# Patient Record
Sex: Female | Born: 1990
Health system: Southern US, Community
[De-identification: ages and names within clinical notes are randomized; demographics above are authoritative.]

## PROBLEM LIST (undated history)

## (undated) DIAGNOSIS — N910 Primary amenorrhea: Secondary | ICD-10-CM

## (undated) DIAGNOSIS — D649 Anemia, unspecified: Secondary | ICD-10-CM

## (undated) DIAGNOSIS — Q201 Double outlet right ventricle: Secondary | ICD-10-CM

## (undated) DIAGNOSIS — E039 Hypothyroidism, unspecified: Secondary | ICD-10-CM

## (undated) DIAGNOSIS — E2839 Other primary ovarian failure: Secondary | ICD-10-CM

## (undated) DIAGNOSIS — E063 Autoimmune thyroiditis: Secondary | ICD-10-CM

## (undated) DIAGNOSIS — K219 Gastro-esophageal reflux disease without esophagitis: Secondary | ICD-10-CM

## (undated) DIAGNOSIS — E049 Nontoxic goiter, unspecified: Secondary | ICD-10-CM

## (undated) DIAGNOSIS — J302 Other seasonal allergic rhinitis: Secondary | ICD-10-CM

## (undated) DIAGNOSIS — L309 Dermatitis, unspecified: Secondary | ICD-10-CM

## (undated) DIAGNOSIS — E288 Other ovarian dysfunction: Secondary | ICD-10-CM

## (undated) DIAGNOSIS — J45909 Unspecified asthma, uncomplicated: Secondary | ICD-10-CM

## (undated) HISTORY — DX: Nontoxic goiter, unspecified: E04.9

## (undated) HISTORY — DX: Hypothyroidism, unspecified: E03.9

## (undated) HISTORY — DX: Other ovarian dysfunction: E28.8

## (undated) HISTORY — DX: Gastro-esophageal reflux disease without esophagitis: K21.9

## (undated) HISTORY — PX: TONSILLECTOMY AND ADENOIDECTOMY: SHX28

## (undated) HISTORY — DX: Other primary ovarian failure: E28.39

## (undated) HISTORY — DX: Double outlet right ventricle: Q20.1

## (undated) HISTORY — DX: Primary amenorrhea: N91.0

## (undated) HISTORY — PX: ADENOIDECTOMY: SUR15

## (undated) HISTORY — PX: TONSILLECTOMY: SUR1361

## (undated) HISTORY — DX: Autoimmune thyroiditis: E06.3

## (undated) HISTORY — DX: Unspecified asthma, uncomplicated: J45.909

## (undated) HISTORY — DX: Dermatitis, unspecified: L30.9

---
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
## 2012-01-31 DIAGNOSIS — Z0271 Encounter for disability determination: Secondary | ICD-10-CM

## 2012-02-20 ENCOUNTER — Other Ambulatory Visit: Payer: Self-pay | Admitting: *Deleted

## 2012-02-20 DIAGNOSIS — E039 Hypothyroidism, unspecified: Secondary | ICD-10-CM

## 2012-02-29 ENCOUNTER — Other Ambulatory Visit: Payer: Self-pay | Admitting: *Deleted

## 2012-02-29 DIAGNOSIS — E063 Autoimmune thyroiditis: Secondary | ICD-10-CM

## 2012-02-29 MED ORDER — LEVOTHYROXINE SODIUM 50 MCG PO TABS: ORAL_TABLET | ORAL | Status: DC

## 2012-02-29 MED ORDER — OMEPRAZOLE 20 MG PO CPDR: 20.0000 mg | DELAYED_RELEASE_CAPSULE | Freq: Two times a day (BID) | ORAL | Status: DC

## 2012-03-05 LAB — TSH: TSH: 3.236 u[IU]/mL (ref 0.350–4.500)

## 2012-03-05 LAB — T4, FREE: Free T4: 1.6 ng/dL (ref 0.80–1.80)

## 2012-03-05 LAB — T3, FREE: T3, Free: 3.1 pg/mL (ref 2.3–4.2)

## 2012-03-13 ENCOUNTER — Encounter: Payer: Self-pay | Admitting: "Endocrinology

## 2012-03-13 ENCOUNTER — Ambulatory Visit (INDEPENDENT_AMBULATORY_CARE_PROVIDER_SITE_OTHER): Payer: BC Managed Care – PPO | Admitting: "Endocrinology

## 2012-03-13 VITALS — BP 113/72 | HR 61 | Wt 144.6 lb

## 2012-03-13 DIAGNOSIS — Q51811 Hypoplasia of uterus: Secondary | ICD-10-CM

## 2012-03-13 DIAGNOSIS — N910 Primary amenorrhea: Secondary | ICD-10-CM

## 2012-03-13 DIAGNOSIS — E063 Autoimmune thyroiditis: Secondary | ICD-10-CM

## 2012-03-13 DIAGNOSIS — N912 Amenorrhea, unspecified: Secondary | ICD-10-CM

## 2012-03-13 DIAGNOSIS — E669 Obesity, unspecified: Secondary | ICD-10-CM

## 2012-03-13 DIAGNOSIS — K219 Gastro-esophageal reflux disease without esophagitis: Secondary | ICD-10-CM

## 2012-03-13 DIAGNOSIS — E038 Other specified hypothyroidism: Secondary | ICD-10-CM

## 2012-03-13 DIAGNOSIS — R6250 Unspecified lack of expected normal physiological development in childhood: Secondary | ICD-10-CM

## 2012-03-13 DIAGNOSIS — Q519 Congenital malformation of uterus and cervix, unspecified: Secondary | ICD-10-CM

## 2012-03-13 LAB — GLUCOSE, POCT (MANUAL RESULT ENTRY): POC Glucose: 94 mg/dl (ref 70–99)

## 2012-03-13 LAB — POCT GLYCOSYLATED HEMOGLOBIN (HGB A1C): Hemoglobin A1C: 5

## 2012-03-13 MED ORDER — LEVOTHYROXINE SODIUM 75 MCG PO TABS: 75.0000 ug | ORAL_TABLET | Freq: Every day | ORAL | Status: DC

## 2012-03-13 NOTE — Progress Notes (Signed)
Chief complaint: Follow-up of primary amenorrhea, hypoplastic uterus, hypothyroidism, Hashimoto's thyroiditis, goiter, and gastroesophageal reflux disease  History of present illness: The patient is an  21 and 19/19 year-old Caucasian female. She was accompanied by her father.  1. I have followed this young woman since 10/28/2010 when she was referred by Dr. Jaymes Graff, [facility] and Gynecology for evaluation of hypothyroidism and primary amenorrhea.   A. At age 19 months the child was noted to be somewhat slow to walk. She was referred to a pediatric neurologist, Dr. Ellison Carwin, for further evaluation. Dr. Sharene Skeans noted some minor gross and fine motor delays, but also noted a significant cardiac murmur. 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 After several years of follow-up, the patient underwent corrective surgery in 2009. She has done well clinically since that time.  B. 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 The geneticist noted the presence of a scoliosis and some limb length discrepancy. She had flattening of the left occipital region as compared to the right. Her scalp hair was thin, especially sparse in the temporal regions. The forehead was flat. She had short palpebral fissures and a wide nasal bridge. Epicanthal folds were present. She had a thin upper lip and some underbite. She also had facial asymmetry with the right cheek being more prominent than the left. The lower lip was also more prominent on the right than on the left. The neck was short and wide. The nipples were felt to be widely spaced. Labia majora were slightly hypoplastic. She had mild soft tissue syndactyly between the third and fourth digits bilaterally. The  right lower extremity was longer than the left. The right foot was longer than the left foot. The right thigh was somewhat longer than the left thigh. The right leg was longer in length compared to the left. All of the right toes were larger than the left toes. Left nails were smaller and dystrophic. She also had a thoracolumbar scoliosis with convexity to the right. She walked with a tilt of her pelvis to the left side. 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 Because the clinical findings suggested the possibility of a genetic disorder, a karyotype was obtained. She had a 42, XX karyotype. A FISH study was also performed. This study showed no evidence for the common form of DiGeorge Syndrome. The geneticist was not able to make a specific genetic diagnosis. Although the geneticist requested that the family return for a follow-up examination, it appears that did not happen.  C. On 09/03/10 the patient was evaluated in [facility]. The reason for referral was primary amenorrhea at age 21. The patient's breast were noted to be Tanner stage III. The vagina looked normal externally. The patient was unable to tolerate either a speculum or pelvic exam. Pregnancy test was negative. Laboratory data showed a TSH of 9.296, free T4 0.91, and T3 of 126.5. TSH was definitely elevated. The free T4 and T3 were in the lower portion of the normal range FSH was 92.5. Prolactin was 4.2. Repeat karyotype was 66, XX. A pelvic ultrasound was performed in the [facility] office. The uterus and ovaries were not clearly seen. Endometrial stripe was not seen. It was commented that additional  imaging may be necessary.  D. The patient was seen by our physician assistant, who obtained additional history to include past issues with allergies, asthma, and gastroesophageal reflux disease. At that point the patient was taking Singulair daily, Advair daily,  omeprazole daily, and cetirizine (Zyrtec) as needed. On examination the patient weighed 148 pounds which was at the 80th percentile. Her height was at the 8th percentile. Her blood pressure was 124/80 and heart rate was 68. She did have somewhat dysmorphic facies. A small, diffusely enlarged, nontender goiter was noted. Laboratory data showed a TSH of 7.517, free T4 of 1.02, and free T3 of 3.4. FSH was 90 and LH was 35. Testosterone was 23.56. Estradiol was 17.2. At that point she was started on Synthroid, 25 mcg per day. 2. Upon our physician assistant's departure at the end of March, the patient was rescheduled to see me on 01/31/2011. She was taking her Synthroid 25, mcg per day at that time. Her goiter was approximately 20-25 g in size. The thyroid was non-tender. She was still amenorrheic. Thyroid tests done on that day showed a TSH of 1.667, free T4  1.26, and free T3 3.5. These tests were mid-range normal. Her TPO antibody was elevated at 299. She was trying to follow our  Eat Right Diet plan. She was also walking at least an hour per day. She had lost 16 pounds. At her visit on 08/30/11, I met with the patient and her father.The patient was then taking Synthroid, 37.5 mcg/day.  I learned that her mother was not attending the visits in our clinic on the building's third floor because the mother was very afraid of heights and elevators. I made arrangements to see the patient and parents in the first floor radiology conference room on 09/13/11. Dr. Lendon Colonel, MD, PhD, our staff geneticist, joined me on that visit and we provided education to the family about possible Turner's Syndrome mosaicism, hypothyroidism, and Hashimoto''s diease.  4. The patient's last [facility] clinic visit was on 12/14/11. In the interim, she has been healthy. She continues taking Synthroid at 50 mcg/day and omeprazole, 20 mg, twice daily. A buccal smear was obtained by Dr. Erik Obey. No evidence for Turner's mosaicism was  identified.  5.Pertinent Review of Systems: Constitutional: The patient feels "good". She is healthy, active, and has no significant complaints. Eyes: Vision is good. There are no significant eye complaints. Neck: The patient has no complaints of anterior neck swelling, soreness, tenderness,  pressure, discomfort, or difficulty swallowing.  Heart: Heart rate increases with exercise or other physical activity. The patient has no complaints of palpitations, irregular heat beats, chest pain, or chest pressure. Gastrointestinal: Belly hunger comes and goes. Bowel movents seem normal. The patient has no complaints of excessive hunger, acid reflux, upset stomach, stomach aches or pains, diarrhea, or constipation. Legs: Muscle mass and strength seem normal. There are no complaints of numbness, tingling, burning, or pain. No edema is noted. Feet: There are no obvious foot problems. There are no complaints of numbness, tingling, burning, or pain. No edema is noted. Hands: She is able to play video games and text quite well.  GYN: The patient has not had any significant change in pubertal development.  PAST MEDICAL, FAMILY, AND SOCIAL HISTORY:  1. School and Family: She is not in school anymore.  2.Activities: She swims in her family's pool twice a day. She also walks a lot.  3. Tobacco, alcohol, and illicit drugs: None 4. PCP: Dr. Lucio Edward 5. GYN: Dr.  Naima Dillard  ROS: There are no other significant problems involving her other body systems.  PHYSICAL EXAM: BP 113/72  Pulse 61  Wt 144 lb 9.6 oz (65.59 kg) Constitutional: The patient looks healthy and appears physically and emotionally well. Although she is alert and pleasant, she does not initiate conversation. Her answers to my questions are very brief. She prefers to focus on her video game. Facies remain asymetric/dysmorphic. The mouth is somewhat twisted rotationally.  Eyes: There is no arcus or proptosis.  Mouth: The oropharynx  appears normal. The tongue appears normal. There is normal oral moisture. There is no obvious gingivitis. Neck: There are no bruits present. The thyroid gland appears normal in size. The thyroid gland is low-lying and is approximately 20+ grams in size. The right lobe is normal. The isthmus is normal. The left lobe is mildly enlarged and somewhat firm.The consistency of the thyroid gland is relatively normal. There is no thyroid tenderness to palpation. Lungs: The lungs are clear. Air movement is good. Heart: The heart rhythm and rate appear normal. Heart sounds S1 and S2 are normal. I do not appreciate any pathologic heart murmurs. Abdomen: The abdominal size is enlarged. Bowel sounds are normal. The abdomen is soft and non-tender. There is no obviously palpable hepatomegaly, splenomegaly, or other masses.  Arms: Muscle mass appears appropriate for age.  Hands: There is no obvious tremor. Phalangeal and metacarpophalangeal joints appear normal. Palms are normal. Legs: Muscle mass appears appropriate for age. There is no edema.  Feet: Normal 1-2+ DP pulses.  Neurologic: Muscle strength is normal for age and gender  in both the upper and the lower extremities. Muscle tone appears normal. Sensation to touch is normal in the legs and feet. Back: she has a marked dorsal kyphosis of kyphoscoliosis.  Laboratory data: 03/05/12: TSH 3.236, free T4 1.60, free T3 3.1      12/14/11: TSH 3.8, free T4 1/39, free T3 3.3, TPO 381, LH 33.4, FSH 86.9, testosterone 33.5, free testosterone 7.2 (normal 1.0-5.0)  Estradiol 13.6,                              08/22/11: TSH 3.183, free T4 1.40, free T3 3.4. testosterone 34.45, estradiol 14.8                  Buccal smear - normal  ASSESSMENT: 1. Hypothyroidism: The patient was hypothyroid again in February on Synthroid, 37.5 mcg/day. We increased her Synthroid dose then to 50 mcg/day. Repeat labs this month showed that she is still mildly hypothyroid. She appears to be  losing thyrocytes faster than we are replacing thyroid hormone.  She needs another dose increase of Synthroid. 2. Hashimoto's thyroiditis: Her Hashimoto's disease is clinically quiescent, but obviously intermittently active. 3. Goiter: The thyroid gland is smaller today, suggesting that she had more thyroid inflammation at last visit than she does now. 4. Primary Amenorrhea/Congenital small uterus: This young woman has a very small uterus. It is unclear from serial [place] studies if she has ovaries, but since she does secrete some testosterone and estradiol, she likely has some ovarian tissue. She could also have one or two ovotestes. All of the tests that have been done so far have ruled out Turner's syndrome and Turner's mosaicism. It is likely that she has some other Disorder of Sexual Development or some other disorder in which uterine and ovarian problems are part of the underlying genetic anomalies. I have discussed  her case again with Dr. Lendon Colonel, who also would like to determine the cause of Shalisha's condition. 5. Obesity: Patient has maintained a stable weight for the past three months. 6. Developmental delays: As noted in November, the patient had an IQ test which gave her an IQ of 64. In my visits with her, she seems to be more mentally retarded than that. Perhaps she is just too shy to talk.  7. GERD: Her GERD is well-controled with omeprazole. Her dyspepsia may not be quite as well controlled.  PLAN: 1. Diagnostic: Repeat the TFTs in two months.   2. Therapeutic: Increase Synthroid to 75 mcg per day.  3. Patient education: Dr. Erik Obey and I want to contact Dr. Wilnette Kales at [facility] to discuss HRT replacement management with her. We discussed the fact that we want to start HRT in order to achieve as much bone mineralization as possible. The uterus may or may not grow. We also discussed the possible need for gonadectomy to remove any possible testicular tissue. I will call Dr.  Normand Sloop once we have talked with Dr. Micael Hampshire so that together we can establish the plan for HRT. 4. Follow-up: Follow-up visit in 2-3 months.  Level of Service: This visit lasted in excess of 40 minutes. More than 50% of the visit was devoted to counseling.  David Stall

## 2012-03-13 NOTE — Patient Instructions (Signed)
Follow-up visit in 2-3 months. Increase synthroid to 75 mcg/day. Repeat thyroid lab tests in two months.

## 2012-03-20 ENCOUNTER — Other Ambulatory Visit: Payer: Self-pay | Admitting: Pediatrics

## 2012-05-10 LAB — T3, FREE: T3, Free: 3.1 pg/mL (ref 2.3–4.2)

## 2012-05-10 LAB — TSH: TSH: 0.351 u[IU]/mL (ref 0.350–4.500)

## 2012-05-10 LAB — T4, FREE: Free T4: 1.73 ng/dL (ref 0.80–1.80)

## 2012-05-16 ENCOUNTER — Ambulatory Visit
Admission: RE | Admit: 2012-05-16 | Discharge: 2012-05-16 | Disposition: A | Discharge status: Home / Self Care | Payer: BC Managed Care – PPO | Source: Ambulatory Visit | Attending: "Endocrinology | Admitting: "Endocrinology

## 2012-05-16 ENCOUNTER — Encounter: Payer: Self-pay | Admitting: "Endocrinology

## 2012-05-16 ENCOUNTER — Ambulatory Visit (INDEPENDENT_AMBULATORY_CARE_PROVIDER_SITE_OTHER): Payer: BC Managed Care – PPO | Admitting: "Endocrinology

## 2012-05-16 VITALS — BP 111/66 | HR 54 | Wt 142.8 lb

## 2012-05-16 DIAGNOSIS — Q519 Congenital malformation of uterus and cervix, unspecified: Secondary | ICD-10-CM

## 2012-05-16 DIAGNOSIS — E038 Other specified hypothyroidism: Secondary | ICD-10-CM

## 2012-05-16 DIAGNOSIS — E669 Obesity, unspecified: Secondary | ICD-10-CM

## 2012-05-16 DIAGNOSIS — E063 Autoimmune thyroiditis: Secondary | ICD-10-CM

## 2012-05-16 DIAGNOSIS — M4120 Other idiopathic scoliosis, site unspecified: Secondary | ICD-10-CM

## 2012-05-16 DIAGNOSIS — Q51811 Hypoplasia of uterus: Secondary | ICD-10-CM

## 2012-05-16 DIAGNOSIS — M419 Scoliosis, unspecified: Secondary | ICD-10-CM

## 2012-05-16 DIAGNOSIS — N910 Primary amenorrhea: Secondary | ICD-10-CM

## 2012-05-16 DIAGNOSIS — E049 Nontoxic goiter, unspecified: Secondary | ICD-10-CM

## 2012-05-16 DIAGNOSIS — K219 Gastro-esophageal reflux disease without esophagitis: Secondary | ICD-10-CM

## 2012-05-16 DIAGNOSIS — N912 Amenorrhea, unspecified: Secondary | ICD-10-CM

## 2012-05-16 LAB — GLUCOSE, POCT (MANUAL RESULT ENTRY): POC Glucose: 115 mg/dl — AB (ref 70–99)

## 2012-05-16 LAB — POCT GLYCOSYLATED HEMOGLOBIN (HGB A1C): Hemoglobin A1C: 4.8

## 2012-05-16 NOTE — Patient Instructions (Addendum)
Follow-up visit in 3 months. Take one Synthroid 75 mcg pill six days per week, but on Sundays take only 1/2 of a Synthroid 75 mcg pill.

## 2012-05-16 NOTE — Progress Notes (Signed)
Chief complaint: Follow-up of primary amenorrhea, hypoplastic uterus, hypothyroidism, Hashimoto's thyroiditis, goiter, and gastroesophageal reflux disease  History of present illness: The patient is an  [age]/19 year-old Caucasian female. She was accompanied by her father.  1. We have followed this young woman since 10/28/2010 when she was referred by Dr. Jaymes Graff, [facility] and Gynecology for evaluation of hypothyroidism and primary amenorrhea.   A. At age 2 months the child was noted to be somewhat slow to walk. She was referred to a pediatric neurologist, Dr. Ellison Carwin, for further evaluation. Dr. Sharene Skeans noted some minor gross and fine motor delays, but also noted a significant cardiac murmur. 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 After several years of follow-up, the patient underwent corrective surgery in 2009. She has done well clinically since that time.  B. 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 The geneticist noted the presence of a scoliosis and some limb length discrepancy. She had flattening of the left occipital region as compared to the right. Her scalp hair was thin, especially sparse in the temporal regions. The forehead was flat. She had short palpebral fissures and a wide nasal bridge. Epicanthal folds were present. She had a thin upper lip and some underbite. She also had facial asymmetry with the right cheek being more prominent than the left. The lower lip was also more prominent on the right than on the left. The neck was short and wide. The nipples were felt to be widely spaced. Labia majora were slightly hypoplastic. She had mild soft tissue syndactyly between the third and fourth digits bilaterally. The  right lower extremity was longer than the left. The right foot was longer than the left foot. The right thigh was somewhat longer than the left thigh. The right leg was longer in length compared to the left. All of the right toes were larger than the left toes. Left nails were smaller and dystrophic. She also had a thoracolumbar scoliosis with convexity to the right. She walked with a tilt of her pelvis to the left side. 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 Because the clinical findings suggested the possibility of a genetic disorder, a karyotype was obtained. She had a 3, XX karyotype. A FISH study was also performed. This study showed no evidence for the common form of DiGeorge Syndrome. The geneticist was not able to make a specific genetic diagnosis. Although the geneticist requested that the family return for a follow-up examination, it appears that did not happen.  C. On 09/03/10 the patient was evaluated in [facility]. The reason for referral was primary amenorrhea at age 21. The patient's breasts were noted to be Tanner stage III. The vagina looked normal externally. The patient was unable to tolerate either a speculum or pelvic exam. Pregnancy test was negative. Laboratory data showed a TSH of 9.296, free T4 0.91, and T3 of 126.5. TSH was definitely elevated. The free T4 and T3 were in the lower portion of the normal range FSH was 92.5. Prolactin was 4.2. Repeat karyotype was 22, XX. A pelvic ultrasound was performed in the [facility] office. The uterus and ovaries were not clearly seen. Endometrial stripe was not seen. It was commented that additional  imaging may be necessary.  D. The patient was seen by our physician assistant, who obtained additional history to include past issues with allergies, asthma, and gastroesophageal reflux disease. At that point the patient was taking Singulair daily, Advair daily,  omeprazole daily, and cetirizine (Zyrtec) as needed. On examination the patient weighed 148 pounds which was at the 80th percentile. Her height was at the 8th percentile. Her blood pressure was 124/80 and heart rate was 68. She did have somewhat dysmorphic facies. A small, diffusely enlarged, nontender goiter was noted. Laboratory data showed a TSH of 7.517, free T4 of 1.02, and free T3 of 3.4. FSH was 90 and LH was 35. Testosterone was 23.56. Estradiol was 17.2. At that point she was started on Synthroid, 25 mcg per day. 2. Upon our physician assistant's departure at the end of March, the patient was rescheduled to see me on 01/31/2011. She was taking her Synthroid 25, mcg per day at that time. Her goiter was approximately 20-25 g in size. The thyroid was non-tender. She was still amenorrheic. Thyroid tests done on that day showed a TSH of 1.667, free T4  1.26, and free T3 3.5. These tests were mid-range normal. Her TPO antibody was elevated at 299. She was trying to follow our  Eat Right Diet plan. She was also walking at least an hour per day. She had lost 16 pounds. At her visit on 08/30/11, I met with the patient and her father.The patient was then taking Synthroid, 37.5 mcg/day.  I learned that her mother was not attending the visits in our clinic on the building's third floor because the mother was very afraid of heights and elevators. I made arrangements to see the patient and parents in the first floor radiology conference room on 09/13/11. Dr. Lendon Colonel, MD, PhD, our staff geneticist, joined me on that visit and we provided education to the family about possible Turner's Syndrome mosaicism, hypothyroidism, and Hashimoto''s diease.  4. The patient's last [facility] clinic visit was on 03/13/12. I increased her synthroid to 75 mcg/day at that time. In the interim, she has been healthy. She continues taking Synthroid at 75 mcg/day and omeprazole, 20 mg, twice daily.  5. On 03/15/12 Dr. Erik Obey and I  contacted Dr. Wilnette Kales, pediatric endocrinologist at [facility] and a nationally recognized expert on Turner's Syndrome. We presented Hanh's case to Dr. Micael Hampshire. Although Dr. Micael Hampshire agreed that [name] likely has a genetic basis for her complex congenital heart disease and other issues, Dr. Micael Hampshire did not feel that Shawnay was a Turner's mosaic, but she unfortunately could not identify any recognized syndrome that would fit [place]. When I asked Dr. Micael Hampshire her opinion as to whether or not we need to surgically define the gonads before beginning hormone replacement therapy,   Dr. Micael Hampshire stated that we probably do not need to do so, but she did understand my concern that if an ovotestis or ovotestes were present, it/they should be removed due to the risk of developing cancer. Dr. Micael Hampshire suggested that when we are ready to start hormone replacement therapy we use Vivelle-Dot transdermal estradiol, beginning with a dose of 25 mcg/day and gradually increasing the dose to 100 mcg/day over 1-2 years time. We should wait for spontaneous occurrence of menses.  We could then add a progestin, such as Prometrium, on days 1-10 of each month.    6. Pertinent Review of Systems: Constitutional: The patient feels "good". She is healthy and has no significant complaints. Eyes: Vision is good.  There are no significant eye complaints. Neck: The patient has no complaints of anterior neck swelling, soreness, tenderness,  pressure, discomfort, or difficulty swallowing.  Heart: The patient has no complaints of palpitations, irregular heat beats, chest pain, or chest pressure. Gastrointestinal: Bowel movents seem normal. The patient has no complaints of excessive hunger, acid reflux, upset stomach, stomach aches or pains, diarrhea, or constipation. Legs: Muscle mass and strength seem normal. There are no complaints of numbness, tingling, burning, or pain. No edema is noted. Feet: There are no obvious foot  problems. There are no complaints of numbness, tingling, burning, or pain. No edema is noted. GYN: The patient has not had any significant increase in pubertal development.  PAST MEDICAL, FAMILY, AND SOCIAL HISTORY:  1. School and Family: She is not in school anymore.  2. Activities: She swims in her family's pool frequently. She also walks a lot.  3. Tobacco, alcohol, and illicit drugs: None 4. PCP: Dr. Lucio Edward 5. GYN: Dr. Samule Ohm Dillard 6. Allergy: Dr. Colonel Bald 7. Genetics: Dr. Rinaldo Cloud Reitnauer  ROS: There are no other significant problems involving her other body systems.  PHYSICAL EXAM: BP 111/66  Pulse 54  Wt 142 lb 12.8 oz (64.774 kg) She has lost two pounds since last visit.  Constitutional: The patient looks healthy and appears physically and emotionally well. Although she is alert and pleasant, she does not initiate conversation. She did pay attention as we talked and gave brief, but reasonable answers. Her insight appears to be poor.  Facies remain asymetric/dysmorphic. The mouth is somewhat twisted rotationally.  Eyes: There is no arcus or proptosis.  Mouth: The oropharynx appears normal. The tongue appears normal. There is normal oral moisture. There is no obvious gingivitis. Neck: There are no bruits present. The thyroid gland appears normal in size. The thyroid gland is low-lying and is approximately 20 grams in size. The isthmus and lobes are within normal size today. The consistency of the thyroid gland is normal. There is no thyroid tenderness to palpation. Lungs: The lungs are clear. Air movement is good. Heart: The heart rhythm and rate appear normal. Heart sounds S1 and S2 are normal. I do not appreciate any pathologic heart murmurs. Abdomen: The abdomen is enlarged. Bowel sounds are normal. The abdomen is soft and non-tender. There is no obviously palpable hepatomegaly, splenomegaly, or other masses.  Arms: Muscle mass appears appropriate for age.  Hands:  There is no obvious tremor. Phalangeal and metacarpophalangeal joints appear normal. Palms are normal. Legs: Muscle mass appears appropriate for age. There is no edema.  Neurologic: Muscle strength is normal for age and gender  in both the upper and the lower extremities. Muscle tone appears normal. Sensation to touch is normal in the legs and feet. Back: She has a marked dorsal kyphosis or kyphoscoliosis.  Laboratory data:   05/09/12: TSH 0.351, free T4 1.73, free T3 3.1  03/05/12: TSH 3.236, free T4 1.60, free T3 3.1  12/14/11: TSH 3.8, free T4 1.39, free T3 3.3, TPO 381, LH 33.4, FSH 86.9, testosterone 33.5, free testosterone 7.2 (normal 1.0-5.0)  Estradiol 13.6,               08/22/11: TSH 3.183, free T4 1.40, free T3 3.4. testosterone 34.45, estradiol 14.8     Buccal smear - normal  ASSESSMENT: 1. Hypothyroidism: The patient was hypothyroid again in May  on Synthroid, 50 mcg/day. We increased her Synthroid dose then to 75 mcg/day. Repeat labs this month showed that she is  mildly hyperthyroid. She did not lose  thyrocytes as fast as I thought that she would. She needs a slight reduction in her dose of Synthroid. 2. Hashimoto's thyroiditis: Her Hashimoto's disease is clinically quiescent, but obviously intermittently active. 3. Goiter: The thyroid gland is smaller today, suggesting that she had more thyroid inflammation at last visit than she does now. The waxing and waning of thyroid gland size is c/w evolving Hashimoto's disease.  4. Primary Amenorrhea/Congenital small uterus: This young woman has a very small uterus. It is unclear from serial [place] studies if she has ovaries, but since she does secrete some testosterone and estradiol, she likely has some ovarian tissue. She could also have one or two ovotestes. All of the tests that have been done so far have ruled out Turner's syndrome and Turner's mosaicism. It is likely that she has some other Disorder of Sexual Development or some other genetic  disorder in which uterine and ovarian problems are part of the underlying genetic anomalies. I reviewed her [place] images today with Dr. Dwyane Dee in [facility]. He feels that an MRI would be the best imaging modality  to better define her ovarian anatomy if we are thinking about the possibility of her having an ovotestis and having to have surgical resection of the ovotestis.  5. Obesity: Patient has had a mild decrease in weight.  6. Developmental delays: As noted in November, the patient had an IQ test which gave her an IQ of 69. In my visits with her, she seems to be more mentally retarded than that.  7. GERD: Her GERD and dyspepsia are well-controlled with omeprazole. At this point she shows no signs of tachyphylaxis.  8. Kyphosis: Dr. Gery Pray also reviewed a scoliosis series from 12/03/07. She definitely had a dorsal kyphosis and a thoracic scoliosis. We need to repeat a scoliosis series to determine if she has had significant progression. The vertebrae on the scoliosis series showed a relatively low calcium content, possibly c/w osteopenia. She needs a bone mineral density to assess her current bone mineral content. If she is low she will definitely need estrogen therapy. We also need to measure her levels of calcium, PTH, phosphorus, and both forms of vitamin D.  PLAN: 1. Diagnostic: Order BMD, scoliosis series, and MRI of the pelvis today.  Repeat the TFTs, testosterone, estradiol, LH, and FSH in three months. Also check calcium, phosphorus, intact PTH, 25-OH vitamin D, and 1,25 vitamin D.  2. Therapeutic: Reduce Synthroid to 75 mcg per day on six days per week, but take only 1/2 pill on Sundays.   3. Patient education: We discussed the fact that we want to start HRT in order to achieve as much bone mineralization as possible. The uterus may or may not grow. We also discussed the possible need for gonadectomy to remove any possible testicular tissue. 4. Follow-up: Follow-up visit in 3  months.  Level of Service: This visit lasted in excess of 55 minutes. More than 50% of the visit was devoted to counseling.  David Stall

## 2012-05-22 ENCOUNTER — Telehealth: Payer: Self-pay | Admitting: Pediatrics

## 2012-05-22 NOTE — Telephone Encounter (Signed)
Mom wants to talk to you regarding her disability

## 2012-07-23 ENCOUNTER — Ambulatory Visit: Payer: BC Managed Care – PPO | Admitting: Pediatrics

## 2012-09-13 ENCOUNTER — Other Ambulatory Visit: Payer: Self-pay | Admitting: "Endocrinology

## 2012-09-17 ENCOUNTER — Other Ambulatory Visit: Payer: Self-pay | Admitting: "Endocrinology

## 2012-10-12 ENCOUNTER — Other Ambulatory Visit: Payer: Self-pay | Admitting: *Deleted

## 2012-10-12 DIAGNOSIS — N910 Primary amenorrhea: Secondary | ICD-10-CM

## 2012-10-14 ENCOUNTER — Other Ambulatory Visit: Payer: Self-pay | Admitting: "Endocrinology

## 2012-10-23 LAB — TSH: TSH: 2.079 u[IU]/mL (ref 0.350–4.500)

## 2012-10-23 LAB — T4, FREE: Free T4: 1.44 ng/dL (ref 0.80–1.80)

## 2012-10-23 LAB — T3, FREE: T3, Free: 2.9 pg/mL (ref 2.3–4.2)

## 2012-10-23 LAB — CALCIUM: Calcium: 9.8 mg/dL (ref 8.4–10.5)

## 2012-10-23 LAB — TESTOSTERONE, FREE, TOTAL, SHBG
Sex Hormone Binding: 25 nmol/L (ref 18–114)
Testosterone, Free: 6.2 pg/mL (ref 0.6–6.8)
Testosterone-% Free: 2.1 % (ref 0.4–2.4)
Testosterone: 29.94 ng/dL (ref 15–40)

## 2012-10-23 LAB — PHOSPHORUS: Phosphorus: 4.5 mg/dL (ref 2.3–4.6)

## 2012-10-23 LAB — LUTEINIZING HORMONE: LH: 36.5 m[IU]/mL

## 2012-10-23 LAB — ESTRADIOL: Estradiol: <11.8 pg/mL

## 2012-10-23 LAB — VITAMIN D 25 HYDROXY (VIT D DEFICIENCY, FRACTURES): Vit D, 25-Hydroxy: 29 ng/mL — ABNORMAL LOW (ref 30–89)

## 2012-10-23 LAB — FOLLICLE STIMULATING HORMONE: FSH: 106.1 m[IU]/mL

## 2012-10-24 ENCOUNTER — Encounter: Payer: Self-pay | Admitting: "Endocrinology

## 2012-10-24 ENCOUNTER — Ambulatory Visit (INDEPENDENT_AMBULATORY_CARE_PROVIDER_SITE_OTHER): Payer: BC Managed Care – PPO | Admitting: "Endocrinology

## 2012-10-24 VITALS — BP 124/82 | HR 69 | Wt 159.8 lb

## 2012-10-24 DIAGNOSIS — N910 Primary amenorrhea: Secondary | ICD-10-CM

## 2012-10-24 DIAGNOSIS — N912 Amenorrhea, unspecified: Secondary | ICD-10-CM

## 2012-10-24 DIAGNOSIS — Q51811 Hypoplasia of uterus: Secondary | ICD-10-CM

## 2012-10-24 DIAGNOSIS — Q519 Congenital malformation of uterus and cervix, unspecified: Secondary | ICD-10-CM

## 2012-10-24 DIAGNOSIS — E049 Nontoxic goiter, unspecified: Secondary | ICD-10-CM

## 2012-10-24 DIAGNOSIS — K3189 Other diseases of stomach and duodenum: Secondary | ICD-10-CM

## 2012-10-24 DIAGNOSIS — E039 Hypothyroidism, unspecified: Secondary | ICD-10-CM

## 2012-10-24 DIAGNOSIS — R6250 Unspecified lack of expected normal physiological development in childhood: Secondary | ICD-10-CM

## 2012-10-24 DIAGNOSIS — E669 Obesity, unspecified: Secondary | ICD-10-CM

## 2012-10-24 DIAGNOSIS — R1013 Epigastric pain: Secondary | ICD-10-CM

## 2012-10-24 DIAGNOSIS — E038 Other specified hypothyroidism: Secondary | ICD-10-CM

## 2012-10-24 DIAGNOSIS — E063 Autoimmune thyroiditis: Secondary | ICD-10-CM

## 2012-10-24 LAB — GLUCOSE, POCT (MANUAL RESULT ENTRY): POC Glucose: 106 mg/dl — AB (ref 70–99)

## 2012-10-24 LAB — POCT GLYCOSYLATED HEMOGLOBIN (HGB A1C): Hemoglobin A1C: 5.5

## 2012-10-24 NOTE — Patient Instructions (Signed)
Follow up visit in 3 months. 

## 2012-10-24 NOTE — Progress Notes (Signed)
Chief complaint: Follow-up of primary amenorrhea, hypoplastic uterus, hypothyroidism, Hashimoto's thyroiditis, goiter, and gastroesophageal reflux disease  History of present illness: The patient is an  22 and 57/20 year-old Caucasian young woman. She was accompanied by her father.  1. We have followed this young woman since 10/28/2010 when she was referred by Dr. Jaymes Graff, [facility] for evaluation of hypothyroidism and primary amenorrhea.   A. At age 26 months the child was noted to be somewhat slow to walk. She was referred to a pediatric neurologist, Dr. Ellison Carwin, for further evaluation. Dr. Sharene Skeans noted some minor gross motor and fine motor delays, but also noted a significant cardiac murmur. 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 After several years of follow-up, the patient underwent corrective surgery in 2009. She has done well clinically since that time.  B. 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 The geneticist noted the presence of a scoliosis and some limb length discrepancy. She had flattening of the left occipital region as compared to the right. Her scalp hair was thin, especially sparse in the temporal regions. The forehead was flat. She had short palpebral fissures and a wide nasal bridge. Epicanthal folds were present. She had a thin upper lip and some underbite. She also had facial asymmetry with the right cheek being more prominent than the left. The lower lip was also more prominent on the right than on the left. The neck was short and wide. The nipples were felt to be widely spaced. Labia majora were slightly hypoplastic. She had mild soft tissue syndactyly between the third and fourth digits  bilaterally. The right lower extremity was longer than the left. The right foot was longer than the left foot. The right thigh was somewhat longer than the left thigh. The right leg was longer in length compared to the left. All of the right toes were larger than the left toes. Left nails were smaller and dystrophic. She also had a thoracolumbar scoliosis with convexity to the right. She walked with a tilt of her pelvis to the left side. 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 Because the clinical findings suggested the possibility of a genetic disorder, a karyotype was obtained. She had a 20, XX karyotype. A FISH study was also performed. This study showed no evidence for the common form of DiGeorge Syndrome. The geneticist was not able to make a specific genetic diagnosis. Although the geneticist requested that the family return for a follow-up examination, it appears that did not happen.  C. On 09/03/10 the patient was evaluated at [facility]. The reason for referral was primary amenorrhea at age 22. The patient's breasts were noted to be Tanner stage III. The vagina looked normal externally. The patient was unable to tolerate either a speculum exam or pelvic exam. Pregnancy test was negative. Laboratory data showed a TSH of 9.296, free T4 0.91, and T3 of 126.5. TSH was definitely elevated. The free T4 and T3 were in the lower portion of the normal range FSH was 92.5. Prolactin was 4.2. Repeat karyotype was 36, XX. A pelvic ultrasound was performed in the [facility] office. The uterus and ovaries were not clearly seen. Endometrial stripe was not seen. It was  commented that additional imaging may be necessary.  D. The patient was seen by our physician assistant, who obtained additional history to include past issues with allergies, asthma, and gastroesophageal reflux disease. At that point the patient was taking Singulair  daily, Advair daily, omeprazole daily, and cetirizine (Zyrtec) as needed. On examination the patient weighed 148 pounds which was at the 80th percentile. Her height was at the 8th percentile. Her blood pressure was 124/80 and heart rate was 68. She did have somewhat dysmorphic facies. A small, diffusely enlarged, nontender goiter was noted. Laboratory data showed a TSH of 7.517, free T4 of 1.02, and free T3 of 3.4. FSH was 90 and LH was 35. Testosterone was 23.56. Estradiol was 17.2. At that point she was started on Synthroid, 25 mcg per day. 2. Upon our physician assistant's departure at the end of March, the patient was rescheduled to see me on 01/31/2011. She was taking her Synthroid 25, mcg per day at that time. Her goiter was approximately 20-25 g in size. The thyroid was non-tender. She was still amenorrheic. Thyroid tests done on that day showed a TSH of 1.667, free T4  1.26, and free T3 3.5. These tests were mid-range normal. Her TPO antibody was elevated at 299. She was trying to follow our  Eat Right Diet plan. She was also walking at least an hour per day. She had lost 16 pounds. At her visit on 08/30/11, I met with the patient and her father.The patient was then taking Synthroid, 37.5 mcg/day.  I learned that her mother was not attending the visits in our clinic on the building's third floor because the mother was very afraid of heights and elevators. I made arrangements to see the patient and parents in the first floor radiology conference room on 09/13/11. Dr. Lendon Colonel, MD, PhD, our staff geneticist, joined me on that visit and we provided education to the family about possible Turner's Syndrome mosaicism, hypothyroidism, and Hashimoto''s diease.  3. On 03/15/12 Dr. Erik Obey and I contacted Dr. Wilnette Kales, pediatric endocrinologist at [facility] and a nationally recognized expert on Turner's Syndrome. We presented Ashiah's case to Dr. Micael Hampshire. Although Dr. Micael Hampshire agreed that [name]  likely has a genetic basis for her complex congenital heart disease and other issues, Dr. Micael Hampshire did not feel that Melannie was a Turner's mosaic, but she unfortunately could not identify any recognized syndrome that would fit Darsha. When I asked Dr. Micael Hampshire her opinion as to whether or not we need to surgically define the gonads before beginning hormone replacement therapy, Dr. Micael Hampshire stated that we probably do not need to do so, but she did understand my concern that if an ovotestis or ovotestes were present, it/they should be removed due to the risk of developing cancer. Dr. Micael Hampshire suggested that when we are ready to start hormone replacement therapy we use Vivelle-Dot transdermal estradiol, beginning with a dose of 25 mcg/day and gradually increasing the dose to 100 mcg/day over 1-2 years time. We should then wait for spontaneous occurrence of menses.  We could then add a progestin, such as Prometrium, on days 1-10 of each month.    4. The patient's last [facility] clinic visit was on 05/16/12. I increased her Synthroid to 75 mcg/day at that time. In the interim, she has been healthy. She continues taking Synthroid at 75 mcg/day and omeprazole, 20 mg, twice daily.  5. Pertinent Review of Systems: Constitutional: The patient feels "good". She is healthy and has no significant complaints. Eyes: Vision  is good. There are no significant eye complaints. Neck: The patient has no complaints of anterior neck swelling, soreness, tenderness,  pressure, discomfort, or difficulty swallowing.  Heart: The patient has no complaints of palpitations, irregular heat beats, chest pain, or chest pressure. Gastrointestinal: Bowel movents seem normal. The patient has no complaints of excessive hunger, acid reflux, upset stomach, stomach aches or pains, diarrhea, or constipation. Legs: Muscle mass and strength seem normal. There are no complaints of numbness, tingling, burning, or pain. No edema is noted. Feet: There are  no obvious foot problems. There are no complaints of numbness, tingling, burning, or pain. No edema is noted. GYN: The patient has not had any significant increase in pubertal development.  PAST MEDICAL, FAMILY, AND SOCIAL HISTORY:  1. School and Family: She is not in school anymore. She tends to snack a lot late at night.  2. Activities: She also walks a lot, but less.  3. Tobacco, alcohol, and illicit drugs: None 4. PCP: Dr. Lucio Edward 5. GYN: Dr. Samule Ohm Dillard 6. Allergy: Dr. Colonel Bald 7. Genetics: Dr. Lendon Colonel  REVIEW OF SYSTEMS: There are no other significant problems involving her other body systems.  PHYSICAL EXAM: BP 124/82  Pulse 69  Wt 159 lb 12.8 oz (72.485 kg) She has gained 17 pounds since last visit, equivalent to about 290-300 calories per day.   Constitutional: The patient looks healthy and appears physically and emotionally well, although heavier. She does have make up on today, for the first time. Although she is alert and pleasant, she does not initiate conversation. She did pay attention as we talked and gave brief, but reasonable answers. Her insight appears to be poor.  Facies remain asymmetric/dysmorphic. The mouth is somewhat twisted rotationally.  Eyes: There is no arcus or proptosis.  Mouth: The oropharynx appears normal. The tongue appears normal. There is normal oral moisture. There is no obvious gingivitis. Neck: There are no bruits present. The thyroid gland appears larger today. The thyroid gland is low-lying and is approximately 20-25 grams in size. The isthmus and right lobe are within normal size today. The left lobe is enlarged. The consistency of the thyroid gland is normal. There is no thyroid tenderness to palpation. Lungs: The lungs are clear. Air movement is good. Heart: The heart rhythm and rate appear normal. Heart sounds S1 and S2 are normal. I do not appreciate any pathologic heart murmurs. She has a soft S4 today. Abdomen: The  abdomen is enlarged. Bowel sounds are normal. The abdomen is soft and non-tender. There is no obviously palpable hepatomegaly, splenomegaly, or other masses.  Arms: Muscle mass appears appropriate for age.  Hands: There is no obvious tremor. Phalangeal and metacarpophalangeal joints appear normal. Palms are normal. Legs: Muscle mass appears appropriate for age. There is no edema.  Neurologic: Muscle strength is normal for age and gender  in both the upper and the lower extremities. Muscle tone appears normal. Sensation to touch is normal in the legs.  Laboratory data:    10/12/12: TSH 2.079, free T4 1.44, free T3 2.9, LH 36.5,  FSH 106.2, estradiol <11.8, Testosterone 29.94, calcium 9.8, phosphorus 4.5, 25-vitamin D 29,  05/09/12: TSH 0.351, free T4 1.73, free T3 3.1  03/05/12: TSH 3.236, free T4 1.60, free T3 3.1  12/14/11: TSH 3.8, free T4 1.39, free T3 3.3, TPO 381, LH 33.4, FSH 86.9, testosterone 33.5, free testosterone 7.2 (normal 1.0-5.0)  Estradiol 13.6,  08/22/11: TSH 3.183, free T4 1.40, free T3 3.4. testosterone 34.45, estradiol 14.8     Buccal smear - normal  ASSESSMENT: 1. Hypothyroidism: The patient is euthyroid now, but she is trending downward again, c/w ongoing thyroiditis. 2. Hashimoto's thyroiditis: Her Hashimoto's disease is clinically quiescent, but obviously intermittently active. 3. Goiter: The thyroid gland is a bit larger today. The waxing and waning of thyroid gland size is c/w evolving Hashimoto's disease.  4. Primary Amenorrhea/Congenital small uterus: As she has been euthyroid, she has had increases in [facility] and FSH into the menopausal range. Her ovaries are making only a very small amount of estradiol in response. If we do not see any improvement in estradiol production in the next 3-6 months, we will conclude that she has essentially so little ovarian tissue that she can not respond. At that point we will begin estrogen therapy. 5. Obesity: Patient has had a  mild decrease in weight at last visit, but a big increase in weight this visit. She needs to burn off more calories than she takes in.  6. Developmental delays: As noted in my November note, the patient had an IQ test which gave her an IQ of 52. In my visits with her, she seems to be more mentally retarded than that.  7. GERD: Her GERD and dyspepsia are well-controlled with omeprazole. At this point she shows no signs of tachyphylaxis.  8. Kyphosis: Dr. Gery Pray also reviewed a scoliosis series from 12/03/07. She definitely had a dorsal kyphosis and a thoracic scoliosis. We need to repeat a scoliosis series to determine if she has had significant progression. The vertebrae on the scoliosis series showed a relatively low calcium content, possibly c/w osteopenia. She needs a bone mineral density to assess her current bone mineral content. If she is low she will definitely need estrogen therapy. We also need to measure her levels of calcium, PTH, phosphorus, and both forms of vitamin D.  PLAN: 1. Diagnostic: Re-order BMD, scoliosis series, and MRI of the pelvis today.  Repeat the TFTs, testosterone, estradiol, LH, and FSH in three months. Also check calcium, phosphorus, intact PTH, 25-OH vitamin D, and 1,25 vitamin D.  2. Therapeutic: Increase Synthroid to 75 mcg per day every day. Add one MVI or citracal-D once a day at supper.    3. Patient education: We discussed the fact that we want to start HRT in order to achieve as much bone mineralization as possible. The uterus may or may not grow. We also discussed the possible need for gonadectomy to remove any possible testicular tissue. 4. Follow-up: Follow-up visit in 3 months.  Level of Service: This visit lasted in excess of 55 minutes. More than 50% of the visit was devoted to counseling.  David Stall

## 2012-10-25 LAB — VITAMIN D 1,25 DIHYDROXY
Vitamin D 1, 25 (OH)2 Total: 55 pg/mL (ref 18–72)
Vitamin D2 1, 25 (OH)2: <8 pg/mL
Vitamin D3 1, 25 (OH)2: 55 pg/mL

## 2012-10-29 ENCOUNTER — Encounter: Payer: Self-pay | Admitting: "Endocrinology

## 2012-10-29 DIAGNOSIS — E039 Hypothyroidism, unspecified: Secondary | ICD-10-CM | POA: Insufficient documentation

## 2013-01-07 ENCOUNTER — Other Ambulatory Visit: Payer: Self-pay | Admitting: *Deleted

## 2013-01-07 DIAGNOSIS — E038 Other specified hypothyroidism: Secondary | ICD-10-CM

## 2013-01-18 LAB — T4, FREE: Free T4: 1.51 ng/dL (ref 0.80–1.80)

## 2013-01-18 LAB — BASIC METABOLIC PANEL WITH GFR
BUN: 10 mg/dL (ref 6–23)
CO2: 26 mEq/L (ref 19–32)
Calcium: 9.9 mg/dL (ref 8.4–10.5)
Chloride: 102 mEq/L (ref 96–112)
Creat: 0.86 mg/dL (ref 0.50–1.10)
GFR, Est African American: >89 mL/min
GFR, Est Non African American: >89 mL/min
Glucose, Bld: 87 mg/dL (ref 70–99)
Potassium: 4.2 mEq/L (ref 3.5–5.3)
Sodium: 140 mEq/L (ref 135–145)

## 2013-01-18 LAB — FOLLICLE STIMULATING HORMONE: FSH: 86.3 m[IU]/mL

## 2013-01-18 LAB — TSH: TSH: 1.156 u[IU]/mL (ref 0.350–4.500)

## 2013-01-18 LAB — PHOSPHORUS: Phosphorus: 4 mg/dL (ref 2.3–4.6)

## 2013-01-18 LAB — ESTRADIOL: Estradiol: 16.2 pg/mL

## 2013-01-18 LAB — T3, FREE: T3, Free: 3.1 pg/mL (ref 2.3–4.2)

## 2013-01-18 LAB — LUTEINIZING HORMONE: LH: 34.4 m[IU]/mL

## 2013-01-19 LAB — VITAMIN D 25 HYDROXY (VIT D DEFICIENCY, FRACTURES): Vit D, 25-Hydroxy: 27 ng/mL — ABNORMAL LOW (ref 30–89)

## 2013-01-21 LAB — PARATHYROID HORMONE, INTACT (NO CA): PTH: 36.8 pg/mL (ref 14.0–72.0)

## 2013-01-21 LAB — TESTOSTERONE, FREE, TOTAL, SHBG
Sex Hormone Binding: 26 nmol/L (ref 18–114)
Testosterone, Free: 9.5 pg/mL — ABNORMAL HIGH (ref 0.6–6.8)
Testosterone-% Free: 2.1 % (ref 0.4–2.4)
Testosterone: 46 ng/dL — ABNORMAL HIGH (ref 15–40)

## 2013-01-22 LAB — VITAMIN D 1,25 DIHYDROXY
Vitamin D 1, 25 (OH)2 Total: 76 pg/mL — ABNORMAL HIGH (ref 18–72)
Vitamin D2 1, 25 (OH)2: <8 pg/mL
Vitamin D3 1, 25 (OH)2: 76 pg/mL

## 2013-01-23 ENCOUNTER — Ambulatory Visit
Admission: RE | Admit: 2013-01-23 | Discharge: 2013-01-23 | Disposition: A | Discharge status: Home / Self Care | Payer: BC Managed Care – PPO | Source: Ambulatory Visit | Attending: "Endocrinology | Admitting: "Endocrinology

## 2013-01-23 ENCOUNTER — Encounter: Payer: Self-pay | Admitting: "Endocrinology

## 2013-01-23 ENCOUNTER — Ambulatory Visit (INDEPENDENT_AMBULATORY_CARE_PROVIDER_SITE_OTHER): Payer: BC Managed Care – PPO | Admitting: "Endocrinology

## 2013-01-23 DIAGNOSIS — M40209 Unspecified kyphosis, site unspecified: Secondary | ICD-10-CM

## 2013-01-23 DIAGNOSIS — E049 Nontoxic goiter, unspecified: Secondary | ICD-10-CM

## 2013-01-23 DIAGNOSIS — M899 Disorder of bone, unspecified: Secondary | ICD-10-CM

## 2013-01-23 DIAGNOSIS — E063 Autoimmune thyroiditis: Secondary | ICD-10-CM

## 2013-01-23 DIAGNOSIS — M949 Disorder of cartilage, unspecified: Secondary | ICD-10-CM

## 2013-01-23 DIAGNOSIS — K219 Gastro-esophageal reflux disease without esophagitis: Secondary | ICD-10-CM

## 2013-01-23 DIAGNOSIS — M4000 Postural kyphosis, site unspecified: Secondary | ICD-10-CM

## 2013-01-23 DIAGNOSIS — N912 Amenorrhea, unspecified: Secondary | ICD-10-CM

## 2013-01-23 DIAGNOSIS — E039 Hypothyroidism, unspecified: Secondary | ICD-10-CM

## 2013-01-23 DIAGNOSIS — N910 Primary amenorrhea: Secondary | ICD-10-CM

## 2013-01-23 DIAGNOSIS — E669 Obesity, unspecified: Secondary | ICD-10-CM

## 2013-01-23 DIAGNOSIS — R6250 Unspecified lack of expected normal physiological development in childhood: Secondary | ICD-10-CM

## 2013-01-23 DIAGNOSIS — M8580 Other specified disorders of bone density and structure, unspecified site: Secondary | ICD-10-CM

## 2013-01-23 LAB — POCT GLYCOSYLATED HEMOGLOBIN (HGB A1C): Hemoglobin A1C: 5.3

## 2013-01-23 LAB — GLUCOSE, POCT (MANUAL RESULT ENTRY): POC Glucose: 103 mg/dl — AB (ref 70–99)

## 2013-01-23 NOTE — Progress Notes (Signed)
Chief complaint: Follow-up of primary amenorrhea, hypoplastic uterus, acquired autoimmune hypothyroidism, Hashimoto's thyroiditis, goiter, kyphosis, scoliosis, osteopenia, s/p repair of double outlet right ventricle, and gastroesophageal reflux disease  History of present illness: The patient is an  22 and 10/20 [age]-old Caucasian young woman. She was accompanied by her father.  1. We have followed this young woman since 10/28/2010 when she was referred by Dr. Jaymes Graff, [facility] and Gynecology for evaluation of hypothyroidism and primary amenorrhea.   A. At age 38 months the child was noted to be somewhat slow to walk. She was referred to a pediatric neurologist, Dr. Ellison Carwin, for further evaluation. Dr. Sharene Skeans noted some minor gross motor and fine motor delays, but also noted a significant cardiac murmur. 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 After several years of follow-up, the patient underwent corrective surgery in 2009. She has done well clinically since that time.  B. 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 The geneticist noted the presence of a scoliosis and some limb length discrepancy. She had flattening of the left occipital region as compared to the right. Her scalp hair was thin, especially sparse in the temporal regions. The forehead was flat. She had short palpebral fissures and a wide nasal bridge. Epicanthal folds were present. She had a thin upper lip and some underbite. She also had facial asymmetry with the right cheek being more prominent than the left. The lower lip was also more prominent on the right than on the left. The neck was short and wide. The nipples were felt to be widely spaced. Labia majora were  slightly hypoplastic. She had mild soft tissue syndactyly between the third and fourth digits bilaterally. The right lower extremity was longer than the left. The right foot was longer than the left foot. The right thigh was somewhat longer than the left thigh. The right leg was longer in length compared to the left. All of the right toes were larger than the left toes. Left nails were smaller and dystrophic. She also had a thoracolumbar scoliosis with convexity to the right. She walked with a tilt of her pelvis to the left side. 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 Because the clinical findings suggested the possibility of a genetic disorder, a karyotype was obtained. She had a 30, XX karyotype. A FISH study was also performed. This study showed no evidence for the common form of DiGeorge Syndrome. The geneticist was not able to make a specific genetic diagnosis. Although the geneticist requested that the family return for a follow-up examination, it appears that did not happen.  C. On 09/03/10 the patient was evaluated at [facility]. The reason for referral was primary amenorrhea at age 77. The patient's breasts were noted to be Tanner stage III. The vagina looked normal externally. The patient was unable to tolerate either a speculum exam or pelvic exam. Pregnancy test was negative. Laboratory data showed a TSH of 9.296, free T4 0.91, and T3 of 126.5. TSH was definitely elevated. The free T4 and T3 were in the lower portion of the normal range FSH was 92.5. Prolactin was 4.2. Repeat karyotype was 37, XX. A pelvic ultrasound was performed in the [facility] office. The uterus and  ovaries were not clearly seen. Endometrial stripe was not seen. It was commented that additional imaging may be necessary.  D. On 10/28/10 the patient was seen by our physician assistant, who obtained additional history to include past issues  with allergies, asthma, and gastroesophageal reflux disease. At that point the patient was taking Singulair daily, Advair daily, omeprazole daily, and cetirizine (Zyrtec) as needed. On examination the patient weighed 148 pounds which was at the 80th percentile. Her height was at the 8th percentile. Her blood pressure was 124/80 and heart rate was 68. She did have somewhat dysmorphic facies. A mildly enlarged, but diffusely enlarged, nontender goiter was noted. Laboratory data showed a TSH of 7.517, free T4 of 1.02, and free T3 of 3.4. FSH was 90 and LH was 35. Testosterone was 23.56. Estradiol was 17.2. At that point she was started on Synthroid, 25 mcg per day. 2. Upon our physician assistant's departure at the end of March 2012, the patient was rescheduled to see me on 01/31/2011. She was taking her Synthroid 25, mcg per day at that time. Her goiter was approximately 20-25 g in size. The thyroid was non-tender. She was still amenorrheic. Thyroid tests done on that day showed a TSH of 1.667, free T4  1.26, and free T3 3.5. These tests were mid-range normal. Her TPO antibody was elevated at 299. She was trying to follow our  Eat Right Diet plan. She was also walking at least an hour per day. She had lost 16 pounds. At her visit on 08/30/11, I met with the patient and her father.The patient was then taking Synthroid, 37.5 mcg/day.  I learned that her mother was not attending the visits in our clinic on the building's third floor because the mother was very afraid of heights and elevators. I made arrangements to see the patient and parents in the first floor radiology conference room on 09/13/11. Dr. Lendon Colonel, MD, PhD, our staff geneticist, joined me on that visit and we provided education to the family about possible Turner's Syndrome mosaicism, hypothyroidism, and Hashimoto's diease.  3. On 03/15/12 Dr. Erik Obey and I contacted Dr. Wilnette Kales, pediatric endocrinologist at [facility] and a nationally  recognized expert on Turner's Syndrome. We presented Eisa's case to Dr. Micael Hampshire. Although Dr. Micael Hampshire agreed that [name] likely has a genetic basis for her complex congenital heart disease and other issues, Dr. Micael Hampshire did not feel that Latria was a Turner's mosaic, but she unfortunately could not identify any recognized syndrome that would fit Emmaline. When I asked Dr. Micael Hampshire her opinion as to whether or not we need to surgically define the gonads before beginning hormone replacement therapy, Dr. Micael Hampshire stated that we probably do not need to do so, but she did understand my concern that if an ovotestis or ovotestes were present, it/they should be removed due to the risk of developing cancer. Dr. Micael Hampshire suggested that when we are ready to start hormone replacement therapy we use Vivelle-Dot transdermal estradiol, beginning with a dose of 25 mcg/day and gradually increasing the dose to 100 mcg/day over 1-2 years time. We should then wait for spontaneous occurrence of menses.  We could then add a progestin, such as Prometrium, on days 1-10 of each month.    4. In succeeding months her Hashimoto's disease T lymphocytes have gradually but progressively destroyed more thyrocytes. In response, we have gradually increased the patient's Synthroid dosage to 75 mcg/day in order to keep her euthyroid.  5. The patient's last [facility] clinic visit was  on 10/24/12. In the interim, she has been healthy. Her allergies have not been acting up. She continues taking Synthroid at 75 mcg/day and omeprazole, 20 mg, twice daily. She also takes Citracal-D once a day. 6. Pertinent Review of Systems: Constitutional: The patient feels "good". She is healthy and has no significant complaints. Eyes: Vision is good. There are no significant eye complaints. Neck: The patient has no complaints of anterior neck swelling, soreness, tenderness,  pressure, discomfort, or difficulty swallowing.  Heart: The patient has no complaints  of palpitations, irregular heat beats, chest pain, or chest pressure. Gastrointestinal: Bowel movents seem normal. The patient has no complaints of excessive hunger, acid reflux, upset stomach, stomach aches or pains, diarrhea, or constipation. Legs: Muscle mass and strength seem normal. There are no complaints of numbness, tingling, burning, or pain. No edema is noted. Feet: There are no obvious foot problems. There are no complaints of numbness, tingling, burning, or pain. No edema is noted. GYN: The patient has not had any significant increase in pubertal development.  PAST MEDICAL, FAMILY, AND SOCIAL HISTORY:  1. School and Family: She is not in school anymore. She tends to snack a lot late at night.  2. Activities: She walks more in the warmer weather.   3. Tobacco, alcohol, and illicit drugs: None 4. PCP: Dr. Lucio Edward 5. GYN: Dr. Samule Ohm Dillard 6. Allergy: Dr. Colonel Bald 7. Genetics: Dr. Lendon Colonel  REVIEW OF SYSTEMS: There are no other significant problems involving her other body systems.  PHYSICAL EXAM: BP 123/74  Pulse 71  Wt 154 lb 12.8 oz (70.217 kg) She has lost 5 pounds since last visit. She is eating more healthy and exercising more.   Constitutional: The patient looks healthy and appears physically and emotionally well. She does not have makeup on today. Although she is alert and pleasant, she does not initiate much conversation. She did pay attention as we talked and gave brief, but reasonable answers. Her insight appears to be a little better today.  Facies remain asymmetric/dysmorphic. The mouth is somewhat twisted rotationally.  Eyes: There is no arcus or proptosis.  Mouth: The oropharynx appears normal. The tongue appears normal. There is normal oral moisture. There is no obvious gingivitis. Neck: There are no bruits present. The thyroid gland is smaller today. The thyroid gland is low-lying and is approximately 20+ grams in size. The isthmus and right lobe  are within normal size today. The left lobe is slightly enlarged. The consistency of the thyroid gland is normal. There is no thyroid tenderness to palpation. Lungs: The lungs are clear. Air movement is good. Heart: The heart rhythm and rate appear normal. Heart sounds S1 and S2 are normal. I do not appreciate any pathologic heart murmurs.  Abdomen: The abdomen is enlarged. Bowel sounds are normal. The abdomen is soft and non-tender. There is no obviously palpable hepatomegaly, splenomegaly, or other masses.  Arms: Muscle mass appears appropriate for age.  Hands: There is no obvious tremor. Phalangeal and metacarpophalangeal joints appear normal. Palms are normal. Legs: Muscle mass appears appropriate for age. There is no edema.  Neurologic: Muscle strength is normal for age and gender  in both the upper and the lower extremities. Muscle tone appears normal. Sensation to touch is normal in the legs.  Laboratory data:  HbA1c today is 5.3%, compared with 5.5% at last visit and with 4.8% at the visit prior.   01/18/13: BMP normal, to include calcium of 9.9; TSH 1.156, free T4 1.51, free  T3 3.1; testosterone 46 (increased from 33.05), estradiol 16.2 (increased from < 11.8); LH 34.4 (decreased from 36.5), FSH 86.3 (decreased from 106.1); PTH 36.8, phosphorus 4.0, 25-hydroxy vitamin D 27  10/12/12: TSH 2.079, free T4 1.44, free T3 2.9, LH 36.5,  FSH 106.2, estradiol <11.8, Testosterone 29.94, calcium 9.8, phosphorus 4.5, 25-vitamin D 29,  05/09/12: TSH 0.351, free T4 1.73, free T3 3.1  03/05/12: TSH 3.236, free T4 1.60, free T3 3.1  12/14/11: TSH 3.8, free T4 1.39, free T3 3.3, TPO 381, LH 33.4, FSH 86.9, testosterone 33.5, free testosterone 7.2 (normal 1.0-5.0)  Estradiol 13.6,               08/22/11: TSH 3.183, free T4 1.40, free T3 3.4. testosterone 34.45, estradiol 14.8     Buccal smear - normal  ASSESSMENT: 1. Hypothyroidism: The patient is euthyroid again. As she's lost weight her TFTs have trended  upward.  2. Hashimoto's thyroiditis: Her Hashimoto's disease is clinically quiescent, but obviously intermittently active. 3. Goiter: The thyroid gland is smaller today. The waxing and waning of thyroid gland size is c/w evolving Hashimoto's disease.  4. Primary Amenorrhea/Congenital hypoplastic uterus: As she has been euthyroid, she has had increases in [facility] and FSH into the menopausal range. Her ovaries are making larger amounts of testosterone and estradiol, enough to cause feedback inhibition reductions in her LH and FSH concentrations. If we see progressive increases in estradiol and testosterone during the next 3 months, we will let nature take its course. If we do not see sufficient improvement in estradiol production in the next 3 months, however, we will conclude that she has essentially so little ovarian tissue that she can not respond. At that point we will begin estrogen therapy. 5. Obesity: Patient has had a mild decrease in weight since last visit. She needs to continue to burn off more calories than she takes in.  6. Developmental delays: As noted in my November 2012 note, the patient had an IQ test which gave her an IQ of 69. In my past visits with her, she seemed to be more mentally retarded than that, but she appears better today.  7. GERD: Her GERD and dyspepsia are well-controlled with omeprazole. At this point she shows no signs of tachyphylaxis.  8. Kyphosis: Dr. Gery Pray also reviewed a scoliosis series from 12/03/07. She definitely had a dorsal kyphosis and a thoracic scoliosis. Her calcium, PTH, and phosphorus are normal. Her vitamin D is a bit low. We need to repeat a scoliosis series to determine if she has had significant progression. She needs a bone mineral density to assess her current bone mineral content. If she is low she will definitely need estrogen therapy.   PLAN: 1. Diagnostic: Re-order BMD and scoliosis series today.  Repeat the  testosterone, estradiol, LH, and FSH in  three months. Also check 25-OH vitamin D and 1,25 vitamin D.  2. Therapeutic: Continue Synthroid to 75 mcg per day per day and omeprazole, 20 mg, twice daily. Take one MVI or Citracal-D, twice daily.   3. Patient education: We discussed the fact that we will probably want to start HRCT in order to achieve as much bone mineralization as possible. The uterus may or may not grow. We also discussed the possible need for gonadectomy to remove any possible testicular tissue. 4. Follow-up: Follow-up visit in 3 months.  Level of Service: This visit lasted in excess of 55 minutes. More than 50% of the visit was devoted to counseling.  David Stall

## 2013-01-23 NOTE — Patient Instructions (Signed)
Follow up visit in 3 months. Please have scoliosis series and bone mineral density study performed.

## 2013-01-24 ENCOUNTER — Telehealth: Payer: Self-pay | Admitting: *Deleted

## 2013-01-24 NOTE — Telephone Encounter (Signed)
LVM to inform that Bone Density study is scheduled for 01-31-13 @ 1:45pm at [facility] hospital. If not convenient please call them [phone]) to change appointment. Dene Gentry

## 2013-01-31 ENCOUNTER — Ambulatory Visit (HOSPITAL_COMMUNITY): Admission: RE | Admit: 2013-01-31 | Payer: BC Managed Care – PPO | Source: Ambulatory Visit

## 2013-02-21 ENCOUNTER — Telehealth: Payer: Self-pay | Admitting: "Endocrinology

## 2013-02-21 NOTE — Telephone Encounter (Signed)
I called Carolyn [name]'s family, but they were not available. I left a VM message that the her scoliosis on the current study looks no worse and maybe is a little better than her previous study.  David Stall

## 2013-03-22 ENCOUNTER — Other Ambulatory Visit: Payer: Self-pay | Admitting: "Endocrinology

## 2013-04-05 ENCOUNTER — Other Ambulatory Visit: Payer: Self-pay | Admitting: *Deleted

## 2013-05-04 LAB — LUTEINIZING HORMONE: LH: 44.8 m[IU]/mL

## 2013-05-04 LAB — VITAMIN D 25 HYDROXY (VIT D DEFICIENCY, FRACTURES): Vit D, 25-Hydroxy: 32 ng/mL (ref 30–89)

## 2013-05-04 LAB — FOLLICLE STIMULATING HORMONE: FSH: 91.4 m[IU]/mL

## 2013-05-04 LAB — ESTRADIOL: Estradiol: 17.5 pg/mL

## 2013-05-06 LAB — TESTOSTERONE, FREE, TOTAL, SHBG
Sex Hormone Binding: 22 nmol/L (ref 18–114)
Testosterone, Free: 14.9 pg/mL — ABNORMAL HIGH (ref 0.6–6.8)
Testosterone-% Free: 2.3 % (ref 0.4–2.4)
Testosterone: 66 ng/dL — ABNORMAL HIGH (ref 15–40)

## 2013-05-08 LAB — VITAMIN D 1,25 DIHYDROXY
Vitamin D 1, 25 (OH)2 Total: 55 pg/mL (ref 18–72)
Vitamin D2 1, 25 (OH)2: <8 pg/mL
Vitamin D3 1, 25 (OH)2: 55 pg/mL

## 2013-05-13 ENCOUNTER — Ambulatory Visit (INDEPENDENT_AMBULATORY_CARE_PROVIDER_SITE_OTHER): Payer: BC Managed Care – PPO | Admitting: "Endocrinology

## 2013-05-13 ENCOUNTER — Other Ambulatory Visit: Payer: Self-pay | Admitting: "Endocrinology

## 2013-05-13 ENCOUNTER — Encounter: Payer: Self-pay | Admitting: "Endocrinology

## 2013-05-13 VITALS — BP 132/76 | HR 79 | Wt 167.0 lb

## 2013-05-13 DIAGNOSIS — N910 Primary amenorrhea: Secondary | ICD-10-CM

## 2013-05-13 DIAGNOSIS — E063 Autoimmune thyroiditis: Secondary | ICD-10-CM

## 2013-05-13 DIAGNOSIS — K219 Gastro-esophageal reflux disease without esophagitis: Secondary | ICD-10-CM

## 2013-05-13 DIAGNOSIS — E049 Nontoxic goiter, unspecified: Secondary | ICD-10-CM

## 2013-05-13 DIAGNOSIS — E038 Other specified hypothyroidism: Secondary | ICD-10-CM

## 2013-05-13 DIAGNOSIS — N912 Amenorrhea, unspecified: Secondary | ICD-10-CM

## 2013-05-13 DIAGNOSIS — M4000 Postural kyphosis, site unspecified: Secondary | ICD-10-CM

## 2013-05-13 DIAGNOSIS — E669 Obesity, unspecified: Secondary | ICD-10-CM

## 2013-05-13 DIAGNOSIS — M40209 Unspecified kyphosis, site unspecified: Secondary | ICD-10-CM

## 2013-05-13 LAB — POCT GLYCOSYLATED HEMOGLOBIN (HGB A1C): Hemoglobin A1C: 5.1

## 2013-05-13 LAB — GLUCOSE, POCT (MANUAL RESULT ENTRY): POC Glucose: 89 mg/dl (ref 70–99)

## 2013-05-13 NOTE — Patient Instructions (Addendum)
Follow up visit in 3 months. Please apply one Minivivelle 0.0375 mg/day patch every 3-4 days.

## 2013-05-13 NOTE — Progress Notes (Signed)
Chief complaint: Follow-up of primary amenorrhea, hypoplastic uterus, acquired autoimmune hypothyroidism, Hashimoto's thyroiditis, goiter, kyphosis, scoliosis, osteopenia, s/p repair of double outlet right ventricle, and gastroesophageal reflux disease  History of present illness: The patient is an  19 and 60/20 year-old Caucasian young woman. She was accompanied by her father.  1. We have followed this young woman since 10/28/2010 when she was referred by Dr. Jaymes Graff, [facility] and Gynecology for evaluation of hypothyroidism and primary amenorrhea.   A. At age 16 months the child was noted to be somewhat slow to walk. She was referred to a pediatric neurologist, Dr. Ellison Carwin, for further evaluation. Dr. Sharene Skeans noted some minor gross motor and fine motor delays, but also noted a significant cardiac murmur. 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 After several years of follow-up, the patient underwent corrective surgery in 2009. She has done well clinically since that time.  B. 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 The geneticist noted the presence of a scoliosis and some limb length discrepancy. She had flattening of the left occipital region as compared to the right. Her scalp hair was thin, especially sparse in the temporal regions. The forehead was flat. She had short palpebral fissures and a wide nasal bridge. Epicanthal folds were present. She had a thin upper lip and some underbite. She also had facial asymmetry with the right cheek being more prominent than the left. The lower lip was also more prominent on the right than on the left. The neck was short and wide. The nipples were felt to be widely spaced. Labia majora were  slightly hypoplastic. She had mild soft tissue syndactyly between the third and fourth digits bilaterally. The right lower extremity was longer than the left. The right foot was longer than the left foot. The right thigh was somewhat longer than the left thigh. The right leg was longer in length compared to the left. All of the right toes were larger than the left toes. Left nails were smaller and dystrophic. She also had a thoracolumbar scoliosis with convexity to the right. She walked with a tilt of her pelvis to the left side. 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 Because the clinical findings suggested the possibility of a genetic disorder, a karyotype was obtained. She had a 70, XX karyotype. A FISH study was also performed. This study showed no evidence for the common form of DiGeorge Syndrome. The geneticist was not able to make a specific genetic diagnosis. Although the geneticist requested that the family return for a follow-up examination, it appears that did not happen.  C. On 09/03/10 the patient was evaluated at [facility]. The reason for referral was primary amenorrhea at age 23. The patient's breasts were noted to be Tanner stage III. The vagina looked normal externally. The patient was unable to tolerate either a speculum exam or pelvic exam. Pregnancy test was negative. Laboratory data showed a TSH of 9.296, free T4 0.91, and T3 of 126.5. TSH was definitely elevated. The free T4 and T3 were in the lower portion of the normal range FSH was 92.5. Prolactin was 4.2. Repeat karyotype was 105, XX. A pelvic ultrasound was performed in the [facility] office. The uterus and  ovaries were not clearly seen. Endometrial stripe was not seen. It was commented that additional imaging may be necessary.  D. On 10/28/10 the patient was seen by our physician assistant, who obtained additional history to include past issues  with allergies, asthma, and gastroesophageal reflux disease. At that point the patient was taking Singulair daily, Advair daily, omeprazole daily, and cetirizine (Zyrtec) as needed. On examination the patient weighed 148 pounds which was at the 80th percentile. Her height was at the 8th percentile. Her blood pressure was 124/80 and heart rate was 68. She did have somewhat dysmorphic facies. A mildly enlarged, but diffusely enlarged, nontender goiter was noted. Laboratory data showed a TSH of 7.517, free T4 of 1.02, and free T3 of 3.4. FSH was 90 and LH was 35. Testosterone was 23.56. Estradiol was 17.2. At that point she was started on Synthroid, 25 mcg per day.  2. Upon our physician assistant's departure at the end of March 2012, the patient was rescheduled to see me on 01/31/2011. She was taking her Synthroid 25, mcg per day at that time. Her goiter was approximately 20-25 g in size. The thyroid was non-tender. She was still amenorrheic. Thyroid tests done on that day showed a TSH of 1.667, free T4  1.26, and free T3 3.5. These tests were mid-range normal. Her TPO antibody was elevated at 299, c/w Hashimoto's thyroiditis. She was trying to follow our  Eat Right Diet plan. She was also walking at least an hour per day. She had lost 16 pounds. At her visit on 08/30/11, I met with the patient and her father.The patient was then taking Synthroid, 37.5 mcg/day.  I learned that her mother was not attending the visits in our clinic on the building's third floor because the mother was very afraid of heights and elevators. I made arrangements to see the patient and parents in the first floor radiology conference room on 09/13/11. Dr. Lendon Colonel, MD, PhD, our staff geneticist, joined me on that visit and we provided education to the family about possible Turner's Syndrome mosaicism, hypothyroidism, and Hashimoto's diease.   3. On 03/15/12 Dr. Erik Obey and I contacted Dr. Wilnette Kales, pediatric  endocrinologist at [facility] and a nationally recognized expert on Turner's Syndrome. We presented Richele's case to Dr. Micael Hampshire. Although Dr. Micael Hampshire agreed that [name] likely has a genetic basis for her complex congenital heart disease and other issues, Dr. Micael Hampshire did not feel that Karinda was a Turner's mosaic, but she unfortunately could not identify any recognized syndrome that would fit Elizette. When I asked Dr. Micael Hampshire her opinion as to whether or not we need to surgically define the gonads before beginning hormone replacement therapy, Dr. Micael Hampshire stated that we probably do not need to do so, but she did understand my concern that if an ovotestis or ovotestes were present, it/they should be removed due to the risk of developing cancer. Dr. Micael Hampshire suggested that when we are ready to start hormone replacement therapy we use Vivelle-Dot transdermal estradiol, beginning with a dose of 25 mcg/day and gradually increasing the dose to 100 mcg/day over 1-2 years time. We should then wait for spontaneous occurrence of menses.  We could then add a progestin, such as Prometrium, on days 1-10 of each month.     4. In succeeding months her Hashimoto's disease T lymphocytes have gradually but progressively destroyed more thyrocytes. In response, we have gradually increased the patient's Synthroid dosage to 75 mcg/day in order to keep her euthyroid.   5.  The patient's last [facility] clinic visit was on 01/23/13. In the interim, she has been healthy. Her allergies have not been acting up. She continues taking Synthroid at 75 mcg/day and omeprazole, 20 mg, twice daily. She also takes Citracal-D once a day.  6. Pertinent Review of Systems: Constitutional: The patient feels "good". She is healthy and has no significant complaints. Eyes: Vision is good. There are no significant eye complaints. Neck: The patient has no complaints of anterior neck swelling, soreness, tenderness,  pressure, discomfort, or difficulty  swallowing.  Heart: The patient has no complaints of palpitations, irregular heat beats, chest pain, or chest pressure. Gastrointestinal: Bowel movents seem normal. The patient has no complaints of excessive hunger, acid reflux, upset stomach, stomach aches or pains, diarrhea, or constipation. Legs: Muscle mass and strength seem normal. There are no complaints of numbness, tingling, burning, or pain. No edema is noted. Feet: There are no obvious foot problems. There are no complaints of numbness, tingling, burning, or pain. No edema is noted. GYN: The patient has not had any significant increase in pubertal development.  PAST MEDICAL, FAMILY, AND SOCIAL HISTORY:  1. School and Family: She is not in school anymore. She tends to snack a lot late at night.  2. Activities: She walks 30-45 minutes about daily.   3. Tobacco, alcohol, and illicit drugs: None 4. PCP: Dr. Lucio Edward 5. GYN: Dr. Samule Ohm Dillard 6. Allergy: Dr. Colonel Bald 7. Genetics: Dr. Lendon Colonel  REVIEW OF SYSTEMS: There are no other significant problems involving her other body systems.  PHYSICAL EXAM: BP 132/76  Pulse 79  Wt 167 lb (75.751 kg) She has gained 13 pounds since last visit. She looks heavier today.  Constitutional: The patient looks healthy and appears physically and emotionally well.  Although she is alert and pleasant, she does not initiate much conversation. She is avidly interested in her video game today. She did pay attention as we talked and gave brief, but reasonable answers. Her insight appears to be about as limited as it was at our last visit.   Facies remain asymmetric/dysmorphic. The mouth is somewhat twisted rotationally.  Eyes: There is no arcus or proptosis.  Mouth: The oropharynx appears normal. The tongue appears normal. There is normal oral moisture. There is no obvious gingivitis. Neck: There are no bruits present. The thyroid gland is smaller today. The thyroid gland is low-lying and  is approximately 20+ grams in size. The isthmus and right lobe are within normal size today. The left lobe is very slightly enlarged. The consistency of the thyroid gland is normal. There is no thyroid tenderness to palpation. Lungs: The lungs are clear. Air movement is good. Heart: The heart rhythm and rate appear normal. Heart sounds S1 and S2 are normal. I do not appreciate any pathologic heart murmurs.  Abdomen: The abdomen is more enlarged today. Bowel sounds are normal. The abdomen is soft and non-tender. There is no obviously palpable hepatomegaly, splenomegaly, or other masses.  Arms: Muscle mass appears appropriate for age.  Hands: There is no obvious tremor. Phalangeal and metacarpophalangeal joints appear normal. Palms are normal. Legs: Muscle mass appears appropriate for age. There is no edema.  Neurologic: Muscle strength is normal for age and gender  in both the upper and the lower extremities. Muscle tone appears normal. Sensation to touch is normal in the legs. Breasts are fatty. No buds. Right areola is Tanner I.1, with poorly defined margins, about 31 mm wide. Left areola is Tanner I, poorly  defined margins, 27 mm wide.   Laboratory data:  HbA1c today is 5.1% today, compared with 5.3% at last visit and with 5.5% at the visit prior.   05/03/13: Estradiol 17.5, testosterone 66, free testosterone 14.9, LH 44.8, FSH 91.4, 25-hydroxy vitamin D 32, 1,25-dihydroxy vitamin D 55  01/18/13: BMP normal, to include calcium of 9.9; TSH 1.156, free T4 1.51, free T3 3.1; testosterone 46 (increased from 33.05), estradiol 16.2 (increased from < 11.8); LH 34.4 (decreased from 36.5), FSH 86.3 (decreased from 106.1); PTH 36.8, phosphorus 4.0, 25-hydroxy vitamin D 27  10/12/12: TSH 2.079, free T4 1.44, free T3 2.9, LH 36.5,  FSH 106.2, estradiol <11.8, Testosterone 29.94, calcium 9.8, phosphorus 4.5, 25-vitamin D 29,  05/09/12: TSH 0.351, free T4 1.73, free T3 3.1  03/05/12: TSH 3.236, free T4 1.60, free T3  3.1  12/14/11: TSH 3.8, free T4 1.39, free T3 3.3, TPO 381, LH 33.4, FSH 86.9, testosterone 33.5, free testosterone 7.2 (normal 1.0-5.0)  Estradiol 13.6,               08/22/11: TSH 3.183, free T4 1.40, free T3 3.4. testosterone 34.45, estradiol 14.8     Buccal smear - normal  ASSESSMENT: 1. Hypothyroidism: The patient is euthyroid again. Due to her weight gain and possibly some increased TBG after we start estrogen replacement therapy, she may need more Synthroid over time.   2. Hashimoto's thyroiditis: Her Hashimoto's disease is clinically quiescent, but obviously intermittently active. 3. Goiter: The thyroid gland is slightly smaller today. The waxing and waning of thyroid gland size is c/w evolving Hashimoto's disease.  4. Primary Amenorrhea/Congenital hypoplastic uterus:   A. When she became euthyroid, she had increases in [facility] [employer] and FSH into the menopausal range. Her testosterone and estradiol have also increased in the past 2 years. Some of these increases may be due to increased ovarian production of these hormones. Some of the increased testosterone may be caused by the effects of overly fat adipose cell cytokines causing insulin resistance and hyperinsulinemia, the hyperinsulinemia in turn causing elevated testosterone. Some of the increased estradiol may be due to increased aromatization of testosterone by adipose cells. Although her estradiol and testosterone have increased, so too have her FSH and LH. If Juanna's ovaries are producing a little more hormone,these small increases are not nearly enough to restore normal balance to the hypothalamic-pituitary-ovarian (HPO) axis. It does not appear that Mother Ashby Dawes will fix [place]. Since we have not seen sufficient improvement in estradiol production in the last year, it's time to begin estradiol replacement therapy.   B. Our immediate estrogen replacement goals are three-fold:   1. To promote estrogenization of breasts and vaginal tissues   2. To  promote feminization of her brain and body overall   3. To improve bone mineral density   C. I presented the package insert information on Minivivelle and highlighted the important parts. I also discusses the [facility] Health Initiative Study results.  D.  I believe that it is safe for Justine to begin the Minivivelle now. As suggested by Dr. Micael Hampshire, we will slowly increase her estrogen doses over a 1-2 year period. We will reassess her clinical exam and lab tests every 3-4 months. We will also want to reassess her pelvic US in about one year. 5. Obesity: Patient has had a marked increase in weight since last visit. Given the improvement in HbA1c and her statement that she is exercising a lot more, it's likely that part of her weight gain is muscle.  She still needs to burn off more calories than she takes in.  6. Developmental delays: As noted in my November 2012 note, the patient had an IQ test which gave her an IQ of 60. In my past visits with her, she seemed to be more mentally retarded than that, but she has been a bit more interactive and engaged the past two visits.   7. GERD: Her GERD and dyspepsia are well-controlled with omeprazole. At this point she shows no signs of tachyphylaxis.  8. Kyphosis: Her repeat scoliosis series did not show progression. In fact, there was a suggestion of improvement.  PLAN: 1. Diagnostic: Re-order BMD.  Repeat the  TFTs, testosterone, estradiol, LH, and FSH in three months.  2. Therapeutic: Add one mini-Vivelle 0.0375 mg patch every 3-4 days. Because I could not order this medication in EPIC, I wrote out a prescription for the patient. Continue Synthroid at 75 mcg per day per day and omeprazole, 20 mg, twice daily. Take one MVI or Citracal-D, twice daily.   3. Patient education: We discussed the fact that we want to start ERT in order to promote feminization and to achieve as much bone mineralization as possible. The uterus may or may not grow. We also discussed the  possible need for hysterectomy and/or gonadectomy to remove any possible testicular tissue. 4. Follow-up: Follow-up visit in 3 months.  Level of Service: This visit lasted in excess of 70 minutes. More than 50% of the visit was devoted to counseling.  David Stall

## 2013-05-14 ENCOUNTER — Other Ambulatory Visit: Payer: Self-pay | Admitting: *Deleted

## 2013-05-14 DIAGNOSIS — M4120 Other idiopathic scoliosis, site unspecified: Secondary | ICD-10-CM

## 2013-05-29 ENCOUNTER — Ambulatory Visit (HOSPITAL_COMMUNITY)
Admission: RE | Admit: 2013-05-29 | Discharge: 2013-05-29 | Disposition: A | Discharge status: Home / Self Care | Payer: BC Managed Care – PPO | Source: Ambulatory Visit | Attending: "Endocrinology | Admitting: "Endocrinology

## 2013-05-29 DIAGNOSIS — N912 Amenorrhea, unspecified: Secondary | ICD-10-CM | POA: Insufficient documentation

## 2013-05-29 DIAGNOSIS — Z13820 Encounter for screening for osteoporosis: Secondary | ICD-10-CM | POA: Insufficient documentation

## 2013-08-07 ENCOUNTER — Other Ambulatory Visit: Payer: Self-pay | Admitting: *Deleted

## 2013-08-07 DIAGNOSIS — N912 Amenorrhea, unspecified: Secondary | ICD-10-CM

## 2013-08-07 DIAGNOSIS — E038 Other specified hypothyroidism: Secondary | ICD-10-CM

## 2013-08-08 DIAGNOSIS — Q210 Ventricular septal defect: Secondary | ICD-10-CM | POA: Insufficient documentation

## 2013-08-08 LAB — BASIC METABOLIC PANEL
BUN: 12 mg/dL (ref 6–23)
CO2: 26 mEq/L (ref 19–32)
Calcium: 9.5 mg/dL (ref 8.4–10.5)
Chloride: 104 mEq/L (ref 96–112)
Creat: 0.99 mg/dL (ref 0.50–1.10)
Glucose, Bld: 90 mg/dL (ref 70–99)
Potassium: 4.4 mEq/L (ref 3.5–5.3)
Sodium: 141 mEq/L (ref 135–145)

## 2013-08-08 LAB — T4, FREE: Free T4: 1.63 ng/dL (ref 0.80–1.80)

## 2013-08-08 LAB — T3, FREE: T3, Free: 3.1 pg/mL (ref 2.3–4.2)

## 2013-08-08 LAB — TSH: TSH: 1.787 u[IU]/mL (ref 0.350–4.500)

## 2013-08-09 LAB — TESTOSTERONE, FREE, TOTAL, SHBG
Sex Hormone Binding: 21 nmol/L (ref 18–114)
Testosterone, Free: 9.8 pg/mL — ABNORMAL HIGH (ref 0.6–6.8)
Testosterone-% Free: 2.3 % (ref 0.4–2.4)
Testosterone: 43 ng/dL — ABNORMAL HIGH (ref 15–40)

## 2013-08-09 LAB — LUTEINIZING HORMONE: LH: 18.3 m[IU]/mL

## 2013-08-09 LAB — FOLLICLE STIMULATING HORMONE: FSH: 46.7 m[IU]/mL

## 2013-08-09 LAB — ESTRADIOL: Estradiol: 22.7 pg/mL

## 2013-08-26 ENCOUNTER — Ambulatory Visit (INDEPENDENT_AMBULATORY_CARE_PROVIDER_SITE_OTHER): Payer: BC Managed Care – PPO | Admitting: "Endocrinology

## 2013-08-26 ENCOUNTER — Encounter: Payer: Self-pay | Admitting: "Endocrinology

## 2013-08-26 VITALS — BP 129/84 | HR 78 | Wt 171.6 lb

## 2013-08-26 DIAGNOSIS — Q519 Congenital malformation of uterus and cervix, unspecified: Secondary | ICD-10-CM

## 2013-08-26 DIAGNOSIS — E038 Other specified hypothyroidism: Secondary | ICD-10-CM

## 2013-08-26 DIAGNOSIS — E349 Endocrine disorder, unspecified: Secondary | ICD-10-CM

## 2013-08-26 DIAGNOSIS — R7989 Other specified abnormal findings of blood chemistry: Secondary | ICD-10-CM

## 2013-08-26 DIAGNOSIS — R7309 Other abnormal glucose: Secondary | ICD-10-CM

## 2013-08-26 DIAGNOSIS — Q51811 Hypoplasia of uterus: Secondary | ICD-10-CM

## 2013-08-26 DIAGNOSIS — K219 Gastro-esophageal reflux disease without esophagitis: Secondary | ICD-10-CM

## 2013-08-26 DIAGNOSIS — I10 Essential (primary) hypertension: Secondary | ICD-10-CM

## 2013-08-26 DIAGNOSIS — E063 Autoimmune thyroiditis: Secondary | ICD-10-CM

## 2013-08-26 DIAGNOSIS — N912 Amenorrhea, unspecified: Secondary | ICD-10-CM

## 2013-08-26 DIAGNOSIS — E049 Nontoxic goiter, unspecified: Secondary | ICD-10-CM

## 2013-08-26 DIAGNOSIS — N910 Primary amenorrhea: Secondary | ICD-10-CM

## 2013-08-26 DIAGNOSIS — E669 Obesity, unspecified: Secondary | ICD-10-CM

## 2013-08-26 LAB — GLUCOSE, POCT (MANUAL RESULT ENTRY): POC Glucose: 99 mg/dl (ref 70–99)

## 2013-08-26 LAB — POCT GLYCOSYLATED HEMOGLOBIN (HGB A1C): Hemoglobin A1C: 5.2

## 2013-08-26 NOTE — Patient Instructions (Signed)
Follow up visit in 3 months. Please continue the mini-Vivelle patch.

## 2013-08-26 NOTE — Progress Notes (Signed)
Chief complaint: Follow-up of primary amenorrhea, hypoplastic uterus, acquired autoimmune hypothyroidism, Hashimoto's thyroiditis, goiter, kyphosis, scoliosis, osteopenia, s/p repair of double outlet right ventricle, and gastroesophageal reflux disease  History of present illness: The patient is a 22 year-old Caucasian young woman. She was accompanied by her father.  1. We have followed this young woman since 10/28/2010 when she was referred by Dr. Jaymes Graff, [facility] for evaluation of hypothyroidism and primary amenorrhea.   A. At age 22 months the child was noted to be somewhat slow to walk. She was referred to a pediatric neurologist, Dr. Ellison Carwin, for further evaluation. Dr. Sharene Skeans noted some minor gross motor and fine motor delays, but also noted a significant cardiac murmur. 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 After several years of follow-up, the patient underwent corrective surgery in 2009. She has done well clinically since that time.  B. 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 The geneticist noted the presence of a scoliosis and some limb length discrepancy. She had flattening of the left occipital region as compared to the right. Her scalp hair was thin, especially sparse in the temporal regions. The forehead was flat. She had short palpebral fissures and a wide nasal bridge. Epicanthal folds were present. She had a thin upper lip and some underbite. She also had facial asymmetry with the right cheek being more prominent than the left. The lower lip was also more prominent on the right than on the left. The neck was short and wide. The nipples were felt to be widely spaced. Labia majora were slightly  hypoplastic. She had mild soft tissue syndactyly between the third and fourth digits bilaterally. The right lower extremity was longer than the left. The right foot was longer than the left foot. The right thigh was somewhat longer than the left thigh. The right leg was longer in length compared to the left. All of the right toes were larger than the left toes. Left nails were smaller and dystrophic. She also had a thoracolumbar scoliosis with convexity to the right. She walked with a tilt of her pelvis to the left side. 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 Because the clinical findings suggested the possibility of a genetic disorder, a karyotype was obtained. She had a 34, XX karyotype. A FISH study was also performed. This study showed no evidence for the common form of DiGeorge Syndrome. The geneticist was not able to make a specific genetic diagnosis. Although the geneticist requested that the family return for a follow-up examination, it appears that did not happen.  C. On 09/03/10 the patient was evaluated at [facility]. The reason for referral was primary amenorrhea at age 59. The patient's breasts were noted to be Tanner stage III. The vagina looked normal externally. The patient was unable to tolerate either a speculum exam or pelvic exam. Pregnancy test was negative. Laboratory data showed a TSH of 9.296, free T4 0.91, and T3 of 126.5. TSH was definitely elevated. The free T4 and T3 were in the lower portion of the normal range FSH was 92.5. Prolactin was 4.2. Repeat karyotype was 29, XX. A pelvic ultrasound was performed in the [facility] office. The uterus and ovaries were not  clearly seen. Endometrial stripe was not seen. It was commented that additional imaging may be necessary.  D. On 10/28/10 the patient was seen by our physician assistant, who obtained additional history to include past issues with  allergies, asthma, and gastroesophageal reflux disease. At that point the patient was taking Singulair daily, Advair daily, omeprazole daily, and cetirizine (Zyrtec) as needed. On examination the patient weighed 148 pounds which was at the 80th percentile. Her height was at the 8th percentile. Her blood pressure was 124/80 and heart rate was 68. She did have somewhat dysmorphic facies. A mildly enlarged, but diffusely enlarged, nontender goiter was noted. Laboratory data showed a TSH of 7.517, free T4 of 1.02, and free T3 of 3.4. FSH was 90 and LH was 35. Testosterone was 23.56. Estradiol was 17.2. At that point she was started on Synthroid, 25 mcg per day.  2. Upon our physician assistant's departure at the end of March 2012, the patient was rescheduled to see me on 01/31/2011. She was taking her Synthroid 25, mcg per day at that time. Her goiter was approximately 20-25 g in size. The thyroid was non-tender. She was still amenorrheic. Thyroid tests done on that day showed a TSH of 1.667, free T4  1.26, and free T3 3.5. These tests were mid-range normal. Her TPO antibody was elevated at 299, c/w Hashimoto's thyroiditis. She was trying to follow our  Eat Right Diet plan. She was also walking at least an hour per day. She had lost 16 pounds. At her visit on 08/30/11, I met with the patient and her father.The patient was then taking Synthroid, 37.5 mcg/day.  I learned that her mother was not attending the visits in our clinic on the building's third floor because the mother was very afraid of heights and elevators. I made arrangements to see the patient and parents in the first floor radiology conference room on 09/13/11. Dr. Lendon Colonel, MD, PhD, our staff geneticist, joined me on that visit and we provided education to the family about possible Turner's Syndrome mosaicism, hypothyroidism, and Hashimoto's diease.   3. On 03/15/12 Dr. Erik Obey and I contacted Dr. Wilnette Kales, pediatric endocrinologist at  [facility] and a nationally recognized expert on Turner's Syndrome. We presented Zyriah's case to Dr. Micael Hampshire. Although Dr. Micael Hampshire agreed that [name] likely has a genetic basis for her complex congenital heart disease and other issues, Dr. Micael Hampshire did not feel that Katanya was a Turner's mosaic, but she unfortunately could not identify any recognized syndrome that would fit Himani. When I asked Dr. Micael Hampshire her opinion as to whether or not we need to surgically define the gonads before beginning hormone replacement therapy, Dr. Micael Hampshire stated that we probably do not need to do so, but she did understand my concern that if an ovotestis or ovotestes were present, it/they should be removed due to the risk of developing cancer. Dr. Micael Hampshire suggested that when we are ready to start hormone replacement therapy we use Vivelle-Dot transdermal estradiol, beginning with a dose of 25 mcg/day and gradually increasing the dose to 100 mcg/day over 1-2 years time. We should then wait for spontaneous occurrence of menses.  We could then add a progestin, such as Prometrium, on days 1-10 of each month.     4. In succeeding months her Hashimoto's disease T lymphocytes have gradually but progressively destroyed more thyrocytes. In response, we have gradually increased the patient's Synthroid dosage to 75 mcg/day in order to keep her euthyroid.   5. The patient's last  [facility] clinic visit was on 05/13/13. I started her on the mini-Vivelle estradiol patches at that visit. She has not noticed any changes in breast size, breast tenderness, or vaginal secretions. In the interim, she has been healthy. Her allergies have not been acting up. She continues taking Synthroid at 75 mcg/day and omeprazole, 20 mg, twice daily. She also takes Citracal-D once a day.  6. Pertinent Review of Systems: Constitutional: The patient feels "good". She is healthy and has no significant complaints. Eyes: Vision is good. There are no significant eye  complaints. Neck: The patient has no complaints of anterior neck swelling, soreness, tenderness,  pressure, discomfort, or difficulty swallowing.  Heart: The patient has no complaints of palpitations, irregular heat beats, chest pain, or chest pressure. She saw her [facility] pediatric cardiologist, Dr Rosiland Oz, in mid-October. He gave hear a clean bill of health. Her next scheduled FU will be in 5 years. Gastrointestinal: Bowel movents seem normal. The patient has no complaints of excessive hunger, acid reflux, upset stomach, stomach aches or pains, diarrhea, or constipation. Legs: Muscle mass and strength seem normal. There are no complaints of numbness, tingling, burning, or pain. No edema is noted. Feet: There are no obvious foot problems. There are no complaints of numbness, tingling, burning, or pain. No edema is noted. GYN: The patient has not had any significant increase in pubertal development.  PAST MEDICAL, FAMILY, AND SOCIAL HISTORY:  1. School and Family: She is not in school anymore. She tends to snack a lot late at night.  2. Activities: She walks 30-45 minutes about daily.   3. Tobacco, alcohol, and illicit drugs: None 4. PCP: Dr. Lucio Edward 5. GYN: Dr. Samule Ohm Dillard 6. Allergy: Dr. Colonel Bald. Maddelyn saw Dr. Willa Rough last month for a routine check up. There were no medication changes.  7. Genetics: Dr. Lendon Colonel  REVIEW OF SYSTEMS: There are no other significant problems involving her other body systems.  PHYSICAL EXAM: BP 129/84  Pulse 78  Wt 171 lb 9.6 oz (77.837 kg) She has gained 4 pounds since last visit. She looks heavier today.  Constitutional: The patient looks healthy and appears physically and emotionally well.  Although she is alert and pleasant, she does not initiate much conversation. She was a little more engaged today. She did pay attention as we talked and gave brief, but reasonable answers. Her insight appears to be about as limited as it was at our  last visit.   Facies remain asymmetric/dysmorphic. The mouth is somewhat twisted rotationally.  Eyes: There is no arcus or proptosis.  Mouth: The oropharynx appears normal. The tongue appears normal. There is normal oral moisture. There is no obvious gingivitis. Neck: There are no bruits present. The thyroid gland is a bit larger today. The thyroid gland is low-lying and is approximately 20-23 grams in size. The isthmus is within normal for size. The right lobe is mildly enlarged. The left lobe is more enlarged. The consistency of the thyroid gland is normal. There is no thyroid tenderness to palpation. Lungs: The lungs are clear. Air movement is good. Heart: The heart rhythm and rate appear normal. Heart sounds S1 and S2 are normal. I do not appreciate any pathologic heart murmurs.  Abdomen: The abdomen is more enlarged today. Bowel sounds are normal. The abdomen is soft and non-tender. There is no obviously palpable hepatomegaly, splenomegaly, or other masses.  Arms: Muscle mass appears appropriate for age.  Hands: There is no obvious tremor. Phalangeal and metacarpophalangeal joints  appear normal. Palms are normal. Legs: Muscle mass appears appropriate for age. There is no edema.  Neurologic: Muscle strength is normal for age and gender  in both the upper and the lower extremities. Muscle tone appears normal. Sensation to touch is normal in the legs. Breasts are fatty. No buds. Right areola is Tanner I.2, with more defined margins, about 40 mm wide. Left areola is Tanner I, with poorly defined margins, 32 mm wide.   Laboratory data:  HbA1c today is 5.2%, compared with 5.1% at last visit and with 5.3% at the visit prior.   08/07/13: BMP normal, with calcium 9.5; estradiol 22.7; FSH 46.7, LH 18.3; TSH 1.787, free T4 1.63, free T3 3.1; testosterone 43  05/03/13: Estradiol 17.5, testosterone 66, free testosterone 14.9, LH 44.8, FSH 91.4, 25-hydroxy vitamin D 32, 1,25-dihydroxy vitamin D 55  01/18/13:  BMP normal, to include calcium of 9.9; TSH 1.156, free T4 1.51, free T3 3.1; testosterone 46 (increased from 33.05), estradiol 16.2 (increased from < 11.8); LH 34.4 (decreased from 36.5), FSH 86.3 (decreased from 106.1); PTH 36.8, phosphorus 4.0, 25-hydroxy vitamin D 27  10/12/12: TSH 2.079, free T4 1.44, free T3 2.9, LH 36.5,  FSH 106.2, estradiol <11.8, Testosterone 29.94, calcium 9.8, phosphorus 4.5, 25-vitamin D 29,  05/09/12: TSH 0.351, free T4 1.73, free T3 3.1  03/05/12: TSH 3.236, free T4 1.60, free T3 3.1  12/14/11: TSH 3.8, free T4 1.39, free T3 3.3, TPO 381, LH 33.4, FSH 86.9, testosterone 33.5, free testosterone 7.2 (normal 1.0-5.0)  Estradiol 13.6,               08/22/11: TSH 3.183, free T4 1.40, free T3 3.4. testosterone 34.45, estradiol 14.8     Buccal smear - normal  IMAGING: BMD 05/29/13: BMD in spine was > 50%. BMD in the hip was above the 95%. Z-scores were 0.3 and 1.68 respectively.   ASSESSMENT: 1. Hypothyroidism: The patient is euthyroid once more. Due to her weight gain and possibly some increased TBG as we advance estrogen replacement therapy, she may need more Synthroid over time.   2. Hashimoto's thyroiditis: Her Hashimoto's disease is clinically quiescent, but intermittently active. 3. Goiter: The thyroid gland is slightly larger today. The waxing and waning of thyroid gland size is c/w evolving Hashimoto's disease.  4. Primary Amenorrhea/Congenital hypoplastic uterus:   A. When she became euthyroid, she had increases in [facility] and FSH into the menopausal range. Her testosterone and estradiol have also increased in the past 2 years. Some of these increases may have been due to increased ovarian production of these hormones. Some of the increased testosterone may have been caused by the effects of overly fat adipose cell cytokines causing insulin resistance and hyperinsulinemia, the hyperinsulinemia in turn causing elevated testosterone. Some of the increased estradiol may have been  due to increased aromatization of testosterone by adipose cells. Although her estradiol and testosterone had increased, so too had her FSH and LH.   B. When we began estrogen replacement therapy, our immediate estrogen replacement goals were three-fold:   1. To promote estrogenization of breasts and vaginal tissues   2. To promote feminization of her brain and body overall   3. To improve bone mineral density   C. Yitta has responded nicely to Mini-Vivelle. Her areolae have increased a bit in size. Her estradiol has increased slightly, but her FSH and LD have decreased quite well.   D. We will continue her on the current dose of mini-Vivelle for an other 3  months, then increase the dose.  As suggested by Dr. Micael Hampshire, we will slowly increase her estrogen doses over a 1-2 year period. We will reassess her clinical exam and lab tests every 3-4 months. We will also want to reassess her pelvic US in about 8 months.. 5. Obesity: Patient's obesity is worse, although her growth velocity for weight has decreased markedly. She still needs to burn off more calories than she takes in.  6. Developmental delays: As noted in my November 2012 note, the patient had an IQ test which gave her an IQ of 52. In my past visits with her, she seemed to be more mentally retarded than that, but she has been a bit more interactive and engaged the past two visits.   7. GERD: Her GERD and dyspepsia are well-controlled with omeprazole. At this point she shows no signs of tachyphylaxis.  8. Kyphosis: Her repeat scoliosis series did not show progression. In fact, there was a suggestion of improvement. 9. Elevated testosterone: Her testosterone level has decreased by 50% since starting mini-Vivelle.  10. Hypertension: Her diastolic BP is greater, c/w her weight gain.   PLAN: 1. Diagnostic: Repeat the  TFTs, testosterone, estradiol, LH, and FSH in 3 months.  2. Therapeutic: Apply one mini-Vivelle 0.0375 mg patch every 3-4 days.   Continue Synthroid at 75 mcg per day per day and omeprazole, 20 mg, twice daily. Take one MVI or Citracal-D, twice daily.   3. Patient education: We discussed the fact that we started ERT in order to promote feminization and to achieve as much bone mineralization as possible. The uterus may or may not grow. We also discussed the possible need for hysterectomy and/or gonadectomy to remove any possible testicular tissue. 4. Follow-up: Follow-up visit in 3 months.  Level of Service: This visit lasted in excess of 70 minutes. More than 50% of the visit was devoted to counseling.  David Stall

## 2013-09-12 ENCOUNTER — Other Ambulatory Visit: Payer: Self-pay | Admitting: "Endocrinology

## 2013-11-19 ENCOUNTER — Other Ambulatory Visit: Payer: Self-pay | Admitting: "Endocrinology

## 2013-11-21 ENCOUNTER — Other Ambulatory Visit: Payer: Self-pay | Admitting: *Deleted

## 2013-11-21 DIAGNOSIS — E669 Obesity, unspecified: Secondary | ICD-10-CM

## 2013-12-03 ENCOUNTER — Ambulatory Visit: Payer: BC Managed Care – PPO | Admitting: "Endocrinology

## 2013-12-11 ENCOUNTER — Other Ambulatory Visit: Payer: Self-pay | Admitting: "Endocrinology

## 2013-12-25 LAB — FOLLICLE STIMULATING HORMONE: FSH: 26.6 m[IU]/mL

## 2013-12-25 LAB — ESTRADIOL: Estradiol: 52.3 pg/mL

## 2013-12-25 LAB — COMPREHENSIVE METABOLIC PANEL
ALT: 15 U/L (ref 0–35)
AST: 14 U/L (ref 0–37)
Albumin: 4.1 g/dL (ref 3.5–5.2)
Alkaline Phosphatase: 103 U/L (ref 39–117)
BUN: 13 mg/dL (ref 6–23)
CO2: 26 mEq/L (ref 19–32)
Calcium: 9.6 mg/dL (ref 8.4–10.5)
Chloride: 102 mEq/L (ref 96–112)
Creat: 0.88 mg/dL (ref 0.50–1.10)
Glucose, Bld: 96 mg/dL (ref 70–99)
Potassium: 4.7 mEq/L (ref 3.5–5.3)
Sodium: 138 mEq/L (ref 135–145)
Total Bilirubin: 0.6 mg/dL (ref 0.2–1.2)
Total Protein: 6.5 g/dL (ref 6.0–8.3)

## 2013-12-25 LAB — T4, FREE: Free T4: 1.42 ng/dL (ref 0.80–1.80)

## 2013-12-25 LAB — HEMOGLOBIN A1C
Hgb A1c MFr Bld: 5.8 % — ABNORMAL HIGH (ref <5.7)
Mean Plasma Glucose: 120 mg/dL — ABNORMAL HIGH (ref <117)

## 2013-12-25 LAB — TESTOSTERONE, FREE, TOTAL, SHBG
Sex Hormone Binding: 26 nmol/L (ref 18–114)
Testosterone, Free: 9.5 pg/mL — ABNORMAL HIGH (ref 0.6–6.8)
Testosterone-% Free: 2.1 % (ref 0.4–2.4)
Testosterone: 46 ng/dL (ref 10–70)

## 2013-12-25 LAB — TSH: TSH: 0.768 u[IU]/mL (ref 0.350–4.500)

## 2013-12-25 LAB — LUTEINIZING HORMONE: LH: 10.3 m[IU]/mL

## 2013-12-25 LAB — T3, FREE: T3, Free: 3 pg/mL (ref 2.3–4.2)

## 2013-12-31 ENCOUNTER — Encounter: Payer: Self-pay | Admitting: "Endocrinology

## 2013-12-31 ENCOUNTER — Ambulatory Visit (INDEPENDENT_AMBULATORY_CARE_PROVIDER_SITE_OTHER): Payer: BC Managed Care – PPO | Admitting: "Endocrinology

## 2013-12-31 VITALS — BP 129/85 | HR 68 | Wt 172.0 lb

## 2013-12-31 DIAGNOSIS — E063 Autoimmune thyroiditis: Secondary | ICD-10-CM

## 2013-12-31 DIAGNOSIS — K219 Gastro-esophageal reflux disease without esophagitis: Secondary | ICD-10-CM

## 2013-12-31 DIAGNOSIS — N910 Primary amenorrhea: Secondary | ICD-10-CM

## 2013-12-31 DIAGNOSIS — R6250 Unspecified lack of expected normal physiological development in childhood: Secondary | ICD-10-CM

## 2013-12-31 DIAGNOSIS — N912 Amenorrhea, unspecified: Secondary | ICD-10-CM

## 2013-12-31 DIAGNOSIS — I10 Essential (primary) hypertension: Secondary | ICD-10-CM

## 2013-12-31 DIAGNOSIS — E038 Other specified hypothyroidism: Secondary | ICD-10-CM

## 2013-12-31 DIAGNOSIS — E669 Obesity, unspecified: Secondary | ICD-10-CM

## 2013-12-31 DIAGNOSIS — Q519 Congenital malformation of uterus and cervix, unspecified: Secondary | ICD-10-CM

## 2013-12-31 DIAGNOSIS — E049 Nontoxic goiter, unspecified: Secondary | ICD-10-CM

## 2013-12-31 NOTE — Patient Instructions (Signed)
Follow up visit in 3 months. 

## 2013-12-31 NOTE — Progress Notes (Signed)
Chief complaint: Follow-up of primary amenorrhea, congenital hypoplastic uterus, acquired autoimmune hypothyroidism, Hashimoto's thyroiditis, goiter, kyphosis, scoliosis, osteopenia, s/p repair of double outlet right ventricle, and gastroesophageal reflux disease  History of present illness: The patient is a 23 year-old Caucasian young woman. She was accompanied by her father.  1. We have followed this young woman since 10/28/2010 when she was referred by Dr. Crawford Givens, [facility] and Gynecology for evaluation of hypothyroidism and primary amenorrhea.   A. At age 8 months the child was noted to be somewhat slow to walk. She was referred to a pediatric neurologist, Dr. Wyline Copas, for further evaluation. Dr. Gaynell Face noted some minor gross motor and fine motor delays, but also noted a significant cardiac murmur. 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 After several years of follow-up, the patient underwent corrective surgery in 2009. She has done well clinically since that time.  B. 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 The geneticist noted the presence of a scoliosis and some limb length discrepancy. She had flattening of the left occipital region as compared to the right. Her scalp hair was thin, especially sparse in the temporal regions. The forehead was flat. She had short palpebral fissures and a wide nasal bridge. Epicanthal folds were present. She had a thin upper lip and some underbite. She also had facial asymmetry with the right cheek being more prominent than the left. The lower lip was also more prominent on the right than on the left. The neck was short and wide. The nipples were felt to be widely spaced. Labia majora were  slightly hypoplastic. She had mild soft tissue syndactyly between the third and fourth digits bilaterally. The right lower extremity was longer than the left. The right foot was longer than the left foot. The right thigh was somewhat longer than the left thigh. The right leg was longer in length compared to the left. All of the right toes were larger than the left toes. Left nails were smaller and dystrophic. She also had a thoracolumbar scoliosis with convexity to the right. She walked with a tilt of her pelvis to the left side. 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 Because the clinical findings suggested the possibility of a genetic disorder, a karyotype was obtained. She had a 50, XX karyotype. A FISH study was also performed. This study showed no evidence for the common form of DiGeorge Syndrome. The geneticist was not able to make a specific genetic diagnosis. Although the geneticist requested that the family return for a follow-up examination, it appears that did not happen.  C. On 09/03/10 the patient was evaluated at [place]. The reason for referral was primary amenorrhea at age 38. The patient's breasts were noted to be Tanner stage III. The vagina looked normal externally. The patient was unable to tolerate either a speculum exam or pelvic exam. Pregnancy test was negative. Laboratory data showed a TSH of 9.296, free T4 0.91, and T3 of 126.5. TSH was definitely elevated. The free T4 and T3 were in the lower portion of the normal range. FSH was very elevated at  92.5. Prolactin was 4.2. Repeat karyotype was 74, XX. A pelvic ultrasound was performed in the [facility] office. The  uterus and ovaries were not clearly seen. Endometrial stripe was not seen. It was commented that additional imaging may be necessary.  D. On 10/28/10 the patient was seen by our physician assistant, who obtained additional history to  include past issues with allergies, asthma, and gastroesophageal reflux disease. At that point the patient was taking Singulair daily, Advair daily, omeprazole daily, and cetirizine (Zyrtec) as needed. On examination the patient weighed 148 pounds which was at the 80th percentile. Her height was at the 8th percentile. Her blood pressure was 124/80 and heart rate was 68. She did have somewhat dysmorphic facies. A mildly enlarged, but diffusely enlarged, nontender goiter was noted. Laboratory data showed a TSH of 7.517, free T4 of 1.02, and free T3 of 3.4. FSH was 90 and LH was 35. Testosterone was 23.56. Estradiol was 17.2. She definitely had both primary hypogonadism and primary hypothyroidism. At that point she was started on Synthroid, 25 mcg per day.  2. Upon our physician assistant's departure at the end of March 2012, the patient was rescheduled to see me on 01/31/2011. She was taking her Synthroid 25, mcg per day at that time. Her goiter was approximately 20-25 g in size. The thyroid was non-tender. She was still amenorrheic. Thyroid tests done on that day showed a TSH of 1.667, free T4  1.26, and free T3 3.5. These tests were mid-range normal. Her TPO antibody was elevated at 299, c/w Hashimoto's thyroiditis. She was trying to follow our  Eat Right Diet plan. She was also walking at least an hour per day. She had lost 16 pounds. At her visit on 08/30/11, I met with the patient and her father.The patient was then taking Synthroid, 37.5 mcg/day.  I learned that her mother was not attending the visits in our clinic on the building's third floor because the mother was very afraid of heights and elevators. I made arrangements to see the patient and parents in the first floor radiology conference room on 09/13/11. Dr. Janeal Holmes, MD, PhD, our staff geneticist, joined me on that visit and we provided education to the family about possible Turner's Syndrome mosaicism, hypothyroidism, and Hashimoto's diease.    3. On 03/15/12 Dr. Abelina Bachelor and I contacted Dr. Freddy Jaksch, pediatric endocrinologist at [facility] and a nationally recognized expert on Turner's Syndrome. We presented Natividad's case to Dr. Melina Fiddler. Although Dr. Melina Fiddler agreed that Ander Purpura likely has a genetic basis for her complex congenital heart disease and other issues, Dr. Melina Fiddler did not feel that Berania was a Turner's mosaic, but she unfortunately could not identify any other recognized syndrome that would fit Delynda. When I asked Dr. Melina Fiddler her opinion as to whether or not we need to surgically define the gonads before beginning hormone replacement therapy, Dr. Melina Fiddler stated that we probably do not need to do so, but she did understand my concern that if an ovotestis or ovotestes were present, it/they should be removed due to the risk of developing cancer. Dr. Melina Fiddler suggested that when we are ready to start hormone replacement therapy we use Vivelle-Dot transdermal estradiol, beginning with a dose of 25 mcg/day and gradually increasing the dose to 100 mcg/day over 1-2 years time. We should then wait for spontaneous occurrence of menses.  We could then add a progestin, such as Prometrium, on days 1-10 of each month.     4. In succeeding months her Hashimoto's disease T lymphocytes have gradually but progressively destroyed more thyrocytes. In response, we have gradually increased the patient's Synthroid dosage  to 75 mcg/day in order to keep her euthyroid.   5. The patient's last [facility] clinic visit was on 08/26/13. I started her on the mini-Vivelle estradiol patches in July 2014. She has not noticed any changes in breast size, breast tenderness, or vaginal secretions. In the interim, she has been healthy. Her allergies have not been acting up. She continues taking Synthroid at 75 mcg/day and omeprazole, 20 mg, twice daily. She also takes a MVI per day, but has not been taking Citracal-D.  6. Pertinent Review of  Systems: Constitutional: The patient feels "good". She is healthy and has no significant complaints. Eyes: Vision is good. There are no significant eye complaints. Neck: The patient has no complaints of anterior neck swelling, soreness, tenderness,  pressure, discomfort, or difficulty swallowing.  Heart: The patient has no complaints of palpitations, irregular heat beats, chest pain, or chest pressure. She saw her [facility] pediatric cardiologist, Dr Ermalene Searing, in mid-October 2014. He gave hear a clean bill of health. Her next scheduled FU will be in 5 years. Gastrointestinal: Bowel movents seem normal. The patient has no complaints of excessive hunger, acid reflux, upset stomach, stomach aches or pains, diarrhea, or constipation. Legs: Muscle mass and strength seem normal. There are no complaints of numbness, tingling, burning, or pain. No edema is noted. Feet: There are no obvious foot problems. There are no complaints of numbness, tingling, burning, or pain. No edema is noted. GYN: The patient has not had any significant increase in pubertal development. Hair: Dad says that she is not losing as much hair as she did before.  PAST MEDICAL, FAMILY, AND SOCIAL HISTORY:  1. School and Family: She is not in school anymore. She tends to snack a lot late at night.  2. Activities: She walks 30-45 minutes about daily when the weather is good.   3. Tobacco, alcohol, and illicit drugs: None 4. PCP: Dr. Saddie Benders 5. GYN: Dr. Gwynneth Munson Dillard 6. Allergy: Dr. Orinda Kenner. Branae will see Dr. Ishmael Holter again next month for a routine check up.  7. Genetics: Dr. Janeal Holmes  REVIEW OF SYSTEMS: There are no other significant problems involving her other body systems.  PHYSICAL EXAM: BP 129/85  Pulse 68  Wt 172 lb (78.019 kg) She has gained .5 pounds since last visit. She is still obese.  Constitutional: The patient looks healthy and appears physically and emotionally well.  Although she is alert and  pleasant, she does not initiate much conversation. She was a little more engaged today. She did pay attention as we talked and gave brief, but reasonable answers. Her insight appears to be about as limited as it was at our last visit.   Facies remain asymmetric/dysmorphic. The mouth is somewhat twisted rotationally.  Eyes: There is no arcus or proptosis.  Mouth: The oropharynx appears normal. The tongue appears normal. There is normal oral moisture. There is no obvious gingivitis. Neck: There are no bruits present. The thyroid gland is a bit larger today. The thyroid gland is low-lying and is approximately 23+ grams in size. The isthmus is within normal for size. The right lobe is mildly enlarged. The left lobe is more enlarged. The isthmus is also enlarged. The consistency of the thyroid gland is normal. There is no thyroid tenderness to palpation. Lungs: The lungs are clear. Air movement is good. Heart: The heart rhythm and rate appear normal. Heart sounds S1 and S2 are normal. I do not appreciate any pathologic heart murmurs.  Abdomen: The abdomen is  more enlarged today. Bowel sounds are normal. The abdomen is soft and non-tender. There is no obviously palpable hepatomegaly, splenomegaly, or other masses.  Arms: Muscle mass appears appropriate for age.  Hands: There is no obvious tremor. Phalangeal and metacarpophalangeal joints appear normal. Palms are normal. Legs: Muscle mass appears appropriate for age. There is no edema.  Neurologic: Muscle strength is normal for age and gender  in both the upper and the lower extremities. Muscle tone appears normal. Sensation to touch is normal in the legs. Her hair is thicker, shinier, and has a finer texture than before.   Laboratory data:  HbA1c on 12/24/13 was 5.8% at the lab, compared with 5.2% at last visit and with 5.1% at the visit prior performed by [facility].  Labs 12/24/13: TSH 0.768, free T4 1.42, free T3 3.0; estradiol 52.3, FSH 26.6, LH 10.3,  testosterone 46; CMP normal   Labs 08/07/13: BMP normal, with calcium 9.5; estradiol 22.7; FSH 46.7, LH 18.3; TSH 1.787, free T4 1.63, free T3 3.1; testosterone 43   Labs 05/03/13: Estradiol 17.5, testosterone 66, free testosterone 14.9, LH 44.8, FSH 91.4, 25-hydroxy vitamin D 32, 1,25-dihydroxy vitamin D 55  Labs 01/18/13: BMP normal, to include calcium of 9.9; TSH 1.156, free T4 1.51, free T3 3.1; testosterone 46 (increased from 33.05), estradiol 16.2 (increased from < 11.8); LH 34.4 (decreased from 36.5), FSH 86.3 (decreased from 106.1); PTH 36.8, phosphorus 4.0, 25-hydroxy vitamin D 27   Labs 10/12/12: TSH 2.079, free T4 1.44, free T3 2.9, LH 36.5,  FSH 106.2, estradiol <11.8, Testosterone 29.94, calcium 9.8, phosphorus 4.5, 25-vitamin D 29,   Labs 05/09/12: TSH 0.351, free T4 1.73, free T3 3.1   Labs 03/05/12: TSH 3.236, free T4 1.60, free T3 3.1   Labs 12/14/11: TSH 3.8, free T4 1.39, free T3 3.3, TPO 381, LH 33.4, FSH 86.9, testosterone 33.5, free testosterone 7.2 (normal 1.0-5.0)  Estradiol 13.6,                Labs 08/22/11: TSH 3.183, free T4 1.40, free T3 3.4. testosterone 34.45, estradiol 14.8     Buccal smear - normal  IMAGING: BMD 05/29/13: BMD in spine was > 50%. BMD in the hip was above the 95%. Z-scores were 0.3 and 1.68 respectively.   ASSESSMENT: 1. Hypothyroidism: The patient is again euthyroid, this time with a lower TSH, lower free T4,m and lower free T3.  Due to her weight gain and possibly some increased TBG as we advance estrogen replacement therapy, she may need more Synthroid over time.   2. Hashimoto's thyroiditis: Her Hashimoto's disease is clinically quiescent, but intermittently active. The pattern of all 3 TFTS shifting together in the same direction, upward or downward, is pathognomonic for a recent flare up of Hashimoto's thyroiditis.  3. Goiter: The thyroid gland is slightly larger today. The waxing and waning of thyroid gland size is c/w evolving Hashimoto's  disease.  4. Primary Amenorrhea/Congenital hypoplastic uterus:   A. When she became euthyroid, she had increases in [facility] and [place] into the menopausal range. Her testosterone and estradiol have also increased in the past 2 years. Some of these increases may have been due to increased ovarian production of these hormones. Some of the increased testosterone may have been caused by the effects of overly fat adipose cell cytokines causing insulin resistance and hyperinsulinemia, the hyperinsulinemia in turn causing elevated testosterone. Some of the increased estradiol may have been due to increased aromatization of testosterone by adipose cells. Although her estradiol and  testosterone had increased, so too had her FSH and LH.   B. When we began estrogen replacement therapy, our immediate estrogen replacement goals were three-fold:   1. To promote estrogenization of breasts and vaginal tissues   2. To promote feminization of her brain and body overall   3. To improve bone mineral density   C. Lekia has responded nicely to [place]. Her areolae had increased a bit in size at last visit. Her estradiol has increased much more. Her [place] and LH have decreased quite a bit.   D. We will continue her on the current dose of mini-Vivelle for an other 3 months, then increase the dose.  As suggested by Dr. Melina Fiddler, we will slowly increase her estrogen doses over a 1-2 year period. We will reassess her clinical exam and lab tests every 3-4 months. We will also want to reassess her pelvic US in about 8 months. 5. Obesity: Patient's obesity is about the same. She has not been exercising as much during th Winter months. She still needs to burn off more calories than she takes in.  6. Developmental delays: As noted in my November 2012 note, the patient had an IQ test which gave her an IQ of 59. In my past visits with her, she seemed to be more mentally retarded than that, but she has been a bit more interactive and engaged  the past two visits.   7. GERD: Her GERD and dyspepsia are well-controlled with omeprazole. At this point she shows no signs of tachyphylaxis.  8. Kyphosis: Her repeat scoliosis series did not show progression. In fact, there was a suggestion of improvement. 9. Elevated testosterone: Her testosterone level has increased a bit, c/e less exercise.   10. Hypertension: Her systolic and diastolic BPs are about the same, c/w her weight gain.   PLAN: 1. Diagnostic: Repeat the  TFTs, testosterone, estradiol, LH, and FSH, plus 25-hydroxy vitamin D, calcium, and PTH in 3 months.  2. Therapeutic: Apply one mini-Vivelle 0.0375 mg patch every 3-4 days.  Continue Synthroid at 75 mcg per day per day and omeprazole, 20 mg, twice daily. Take one MVI or Citracal-D, twice daily.   3. Patient education: We discussed the fact that we started ERT in order to promote feminization and to achieve as much bone mineralization as possible. The uterus may or may not grow. We also discussed the possible need for hysterectomy and/or gonadectomy to remove any possible testicular tissue. 4. Follow-up: Follow-up visit in 3 months.  Level of Service: This visit lasted in excess of 70 minutes. More than 50% of the visit was devoted to counseling.  Sherrlyn Hock

## 2014-03-09 ENCOUNTER — Other Ambulatory Visit: Payer: Self-pay | Admitting: "Endocrinology

## 2014-03-15 ENCOUNTER — Other Ambulatory Visit: Payer: Self-pay | Admitting: "Endocrinology

## 2014-04-10 ENCOUNTER — Ambulatory Visit: Payer: BC Managed Care – PPO | Admitting: "Endocrinology

## 2014-05-30 ENCOUNTER — Other Ambulatory Visit: Payer: Self-pay | Admitting: "Endocrinology

## 2014-07-07 ENCOUNTER — Other Ambulatory Visit: Payer: Self-pay | Admitting: *Deleted

## 2014-07-07 DIAGNOSIS — E669 Obesity, unspecified: Secondary | ICD-10-CM

## 2014-07-08 ENCOUNTER — Other Ambulatory Visit: Payer: Self-pay | Admitting: "Endocrinology

## 2014-07-10 ENCOUNTER — Ambulatory Visit: Payer: BC Managed Care – PPO | Admitting: "Endocrinology

## 2014-07-22 LAB — T4, FREE: Free T4: 1.75 ng/dL (ref 0.80–1.80)

## 2014-07-22 LAB — COMPREHENSIVE METABOLIC PANEL
ALT: 17 U/L (ref 0–35)
AST: 18 U/L (ref 0–37)
Albumin: 4.4 g/dL (ref 3.5–5.2)
Alkaline Phosphatase: 85 U/L (ref 39–117)
BUN: 13 mg/dL (ref 6–23)
CO2: 23 mEq/L (ref 19–32)
Calcium: 9.4 mg/dL (ref 8.4–10.5)
Chloride: 103 mEq/L (ref 96–112)
Creat: 0.94 mg/dL (ref 0.50–1.10)
Glucose, Bld: 82 mg/dL (ref 70–99)
Potassium: 4.5 mEq/L (ref 3.5–5.3)
Sodium: 139 mEq/L (ref 135–145)
Total Bilirubin: 0.4 mg/dL (ref 0.2–1.2)
Total Protein: 6.8 g/dL (ref 6.0–8.3)

## 2014-07-22 LAB — LUTEINIZING HORMONE: LH: 30.1 m[IU]/mL

## 2014-07-22 LAB — HEMOGLOBIN A1C
Hgb A1c MFr Bld: 6.1 % — ABNORMAL HIGH (ref <5.7)
Mean Plasma Glucose: 128 mg/dL — ABNORMAL HIGH (ref <117)

## 2014-07-22 LAB — TSH: TSH: 3.596 u[IU]/mL (ref 0.350–4.500)

## 2014-07-22 LAB — FOLLICLE STIMULATING HORMONE: FSH: 64.1 m[IU]/mL

## 2014-07-22 LAB — ESTRADIOL: Estradiol: 30.2 pg/mL

## 2014-07-22 LAB — TESTOSTERONE, FREE, TOTAL, SHBG
Sex Hormone Binding: 18 nmol/L (ref 18–114)
Testosterone, Free: 10 pg/mL — ABNORMAL HIGH (ref 0.6–6.8)
Testosterone-% Free: 2.4 % (ref 0.4–2.4)
Testosterone: 41 ng/dL (ref 10–70)

## 2014-07-22 LAB — VITAMIN D 25 HYDROXY (VIT D DEFICIENCY, FRACTURES): Vit D, 25-Hydroxy: 33 ng/mL (ref 30–89)

## 2014-07-22 LAB — PTH, INTACT AND CALCIUM
Calcium: 9.4 mg/dL (ref 8.4–10.5)
PTH: 42 pg/mL (ref 14–64)

## 2014-07-22 LAB — CALCIUM: Calcium: 9.4 mg/dL (ref 8.4–10.5)

## 2014-07-22 LAB — T3, FREE: T3, Free: 3.3 pg/mL (ref 2.3–4.2)

## 2014-07-28 ENCOUNTER — Encounter: Payer: Self-pay | Admitting: "Endocrinology

## 2014-07-28 ENCOUNTER — Ambulatory Visit (INDEPENDENT_AMBULATORY_CARE_PROVIDER_SITE_OTHER): Payer: BC Managed Care – PPO | Admitting: "Endocrinology

## 2014-07-28 VITALS — BP 127/87 | HR 65 | Wt 176.3 lb

## 2014-07-28 DIAGNOSIS — N910 Primary amenorrhea: Secondary | ICD-10-CM | POA: Diagnosis not present

## 2014-07-28 DIAGNOSIS — E038 Other specified hypothyroidism: Secondary | ICD-10-CM

## 2014-07-28 DIAGNOSIS — E063 Autoimmune thyroiditis: Secondary | ICD-10-CM | POA: Diagnosis not present

## 2014-07-28 DIAGNOSIS — I10 Essential (primary) hypertension: Secondary | ICD-10-CM

## 2014-07-28 DIAGNOSIS — E049 Nontoxic goiter, unspecified: Secondary | ICD-10-CM

## 2014-07-28 DIAGNOSIS — E300 Delayed puberty: Secondary | ICD-10-CM

## 2014-07-28 DIAGNOSIS — E663 Overweight: Secondary | ICD-10-CM

## 2014-07-28 DIAGNOSIS — R6250 Unspecified lack of expected normal physiological development in childhood: Secondary | ICD-10-CM

## 2014-07-28 MED ORDER — LEVOTHYROXINE SODIUM 88 MCG PO TABS: ORAL_TABLET | ORAL | Status: DC

## 2014-07-28 NOTE — Progress Notes (Signed)
Chief complaint: Follow-up of primary amenorrhea, congenital hypoplastic uterus, acquired autoimmune hypothyroidism, Hashimoto's thyroiditis, goiter, kyphosis, scoliosis, osteopenia, s/p repair of double outlet right ventricle, and gastroesophageal reflux disease  History of present illness: The patient is a 23 year-old Caucasian young woman. She was accompanied by her father.  1. We have followed this young woman since 10/28/2010 when she was referred by Dr. Crawford Givens, [facility] and Gynecology for evaluation of hypothyroidism and primary amenorrhea.   A. At age 3 months the child was noted to be somewhat slow to walk. She was referred to a pediatric neurologist, Dr. Wyline Copas, for further evaluation. Dr. Gaynell Face noted some minor gross motor and fine motor delays, but also noted a significant cardiac murmur. 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 After several years of follow-up, the patient underwent corrective surgery in 2009. She has done well clinically since that time.  B. 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 The geneticist noted the presence of a scoliosis and some limb length discrepancy. She had flattening of the left occipital region as compared to the right. Her scalp hair was thin, especially sparse in the temporal regions. The forehead was flat. She had short palpebral fissures and a wide nasal bridge. Epicanthal folds were present. She had a thin upper lip and some underbite. She also had facial asymmetry with the right cheek being more prominent than the left. The lower lip was also more prominent on the right than on the left. The neck was short and wide. The nipples were felt to be widely spaced. Labia majora were  slightly hypoplastic. She had mild soft tissue syndactyly between the third and fourth digits bilaterally. The right lower extremity was longer than the left. The right foot was longer than the left foot. The right thigh was somewhat longer than the left thigh. The right leg was longer in length compared to the left. All of the right toes were larger than the left toes. Left nails were smaller and dystrophic. She also had a thoracolumbar scoliosis with convexity to the right. She walked with a tilt of her pelvis to the left side. 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 Because the clinical findings suggested the possibility of a genetic disorder, a karyotype was obtained. She had a 34, XX karyotype. A FISH study was also performed. This study showed no evidence for the common form of DiGeorge Syndrome. The geneticist was not able to make a specific genetic diagnosis. Although the geneticist requested that the family return for a follow-up examination, it appears that did not happen.  C. On 09/03/10 the patient was evaluated at [place]. The reason for referral was primary amenorrhea at age 38. The patient's breasts were noted to be Tanner stage III. The vagina looked normal externally. The patient was unable to tolerate either a speculum exam or pelvic exam. Pregnancy test was negative. Laboratory data showed a TSH of 9.296, free T4 0.91, and T3 of 126.5. Her TSH was definitely elevated. The free T4 and T3 were in the lower portion of the normal range. FSH was very elevated at  92.5. Prolactin was 4.2. Repeat karyotype was 62, XX. A pelvic ultrasound was performed in the [facility] office.  The uterus and ovaries were not clearly seen. Endometrial stripe was not seen. It was commented that additional imaging may be necessary.  D. On 10/28/10 the patient was seen by our physician assistant, who obtained additional history to  include past issues with allergies, asthma, and gastroesophageal reflux disease. At that point the patient was taking Singulair daily, Advair daily, omeprazole daily, and cetirizine (Zyrtec) as needed. On examination the patient weighed 148 pounds which was at the 80th percentile. Her height was at the 8th percentile. Her blood pressure was 124/80 and heart rate was 68. She did have somewhat dysmorphic facies. A mildly enlarged, but diffusely enlarged, nontender goiter was noted. Laboratory data showed a TSH of 7.517, free T4 of 1.02, and free T3 of 3.4. FSH was 90 and LH was 35. Testosterone was 23.56. Estradiol was 17.2. She definitely had both primary hypogonadism and primary hypothyroidism. At that point she was started on Synthroid, 25 mcg per day.  2. Upon our physician assistant's departure at the end of March 2012, the patient was rescheduled to see me on 01/31/2011. She was taking her Synthroid 25, mcg per day at that time. Her goiter was approximately 20-25 g in size. The thyroid was non-tender. She was still amenorrheic. Thyroid tests done on that day showed a TSH of 1.667, free T4  1.26, and free T3 3.5. These tests were mid-range normal. Her TPO antibody was elevated at 299, c/w Hashimoto's thyroiditis. She was trying to follow our  Eat Right Diet plan. She was also walking at least an hour per day. She had lost 16 pounds. At her visit on 08/30/11, I met with the patient and her father.The patient was then taking Synthroid, 37.5 mcg/day.  I learned that her mother was not attending the visits in our clinic on the building's third floor because the mother was very afraid of heights and elevators. I made arrangements to see the patient and parents in the first floor radiology conference room on 09/13/11. Dr. Janeal Holmes, MD, PhD, our staff geneticist, joined me on that visit and we provided education to the family about possible Turner's Syndrome mosaicism, hypothyroidism, and Hashimoto's diease.    3. On 03/15/12 Dr. Abelina Bachelor and I contacted Dr. Freddy Jaksch, pediatric endocrinologist at [facility] and a nationally recognized expert on Turner's Syndrome. We presented Siarra's case to Dr. Melina Fiddler. Although Dr. Melina Fiddler agreed that Ander Purpura likely has a genetic basis for her complex congenital heart disease and other issues, Dr. Melina Fiddler did not feel that Ayriana was a Turner's mosaic, but she unfortunately could not identify any other recognized syndrome that would fit Kashana. When I asked Dr. Melina Fiddler her opinion as to whether or not we need to surgically define the gonads before beginning hormone replacement therapy, Dr. Melina Fiddler stated that we probably did not need to do so, but she did understand my concern that if an ovotestis or ovotestes were present, it/they should be removed due to the risk of developing cancer. Dr. Melina Fiddler suggested that when we are ready to start hormone replacement therapy we use Vivelle-Dot transdermal estradiol, beginning with a dose of 25 mcg/day and gradually increasing the dose to 100 mcg/day over 1-2 years time. We should then wait for spontaneous occurrence of menses.  We could then add a progestin, such as Prometrium, on days 1-10 of each month.     4. In succeeding months her Hashimoto's disease T lymphocytes have gradually but progressively destroyed more thyrocytes. In response, we have gradually increased the patient's Synthroid  dosage to 75 mcg/day in order to keep her euthyroid.   5. The patient's last [facility] clinic visit was on 12/31/13. I started her on the mini-Vivelle estradiol patches in July 2014. She has not noticed any changes in breast size, breast tenderness, or vaginal secretions. In the interim, she has been healthy. Her allergies have not been acting up. She continues taking Synthroid at 75 mcg/day and omeprazole, 20 mg, twice daily. She also takes a MVI per day, and Citracal-D once per day.  6. Pertinent Review of Systems: Constitutional: The  patient feels "good". She is healthy and has no significant complaints. Eyes: Vision is good. There are no significant eye complaints. Neck: The patient has no complaints of anterior neck swelling, soreness, tenderness,  pressure, discomfort, or difficulty swallowing.  Heart: The patient has no complaints of palpitations, irregular heat beats, chest pain, or chest pressure. She saw her [facility] pediatric cardiologist, Dr Ermalene Searing, in mid-October 2014. He gave hear a clean bill of health. Her next scheduled FU will be in 5 years. Gastrointestinal: Bowel movents seem normal. The patient has no complaints of excessive hunger, acid reflux, upset stomach, stomach aches or pains, diarrhea, or constipation. Legs: Muscle mass and strength seem normal. There are no complaints of numbness, tingling, burning, or pain. No edema is noted. Feet: There are no obvious foot problems. There are no complaints of numbness, tingling, burning, or pain. No edema is noted. GYN: The patient has not had any significant increase in pubertal development. Hair: She says that she is still losing hair. Dad says that she may be losing a little more hair than at last visit.   PAST MEDICAL, FAMILY, AND SOCIAL HISTORY:  1. School and Family: She is not in school anymore. She tends to snack a lot late at night. Her mother has recently been diagnosed with lung cancer. 2. Activities: She walks 30-60 minutes daily when the weather is good.   3. Tobacco, alcohol, and illicit drugs: None 4. PCP: Dr. Saddie Benders 5. GYN: Dr. Gwynneth Munson Dillard 6. Allergy: Dr. Orinda Kenner.saw her last week and discontinued her Qvar. Dauna no longer needs the Advair or albuterol MDI. 7. Genetics: Dr. Janeal Holmes  REVIEW OF SYSTEMS: There are no other significant problems involving her other body systems.  PHYSICAL EXAM: BP 127/87  Pulse 65  Wt 176 lb 4.8 oz (79.969 kg) She has lost 4 pounds since last visit, but she is still obese.   Constitutional: The patient looks healthy and appears physically and emotionally well.  Although she is alert and pleasant, she does not initiate much conversation. She was a little more engaged today. She did pay attention as we talked and gave brief, but reasonable answers. Her insight appears to be about as limited as it was at our last visit.   Facies remain asymmetric/dysmorphic. The mouth is somewhat twisted rotationally.  Eyes: There is no arcus or proptosis.  Mouth: The oropharynx appears normal. The tongue appears normal. There is normal oral moisture. There is no obvious gingivitis. Neck: There are no bruits present. The thyroid gland is low-lying.The thyroid gland is a bit larger today at about 23-24 grams in size. The isthmus is somewhat enlarged today. The lobes are symmetrically but diffusely enlarged. The consistency of the thyroid gland is normal. There is no thyroid tenderness to palpation. Lungs: The lungs are clear. Air movement is good. Heart: The heart rhythm and rate appear normal. Heart sounds S1 and S2 are normal. I do not appreciate  any pathologic heart murmurs.  Abdomen: The abdomen is less enlarged today. Bowel sounds are normal. The abdomen is soft and non-tender. There is no obviously palpable hepatomegaly, splenomegaly, or other masses.  Arms: Muscle mass appears appropriate for age.  Hands: There is no obvious tremor. Phalangeal and metacarpophalangeal joints appear normal. Palms are normal. Legs: Muscle mass appears appropriate for age. There is no edema.  Neurologic: Muscle strength is normal for age and gender  in both the upper and the lower extremities. Muscle tone appears normal. Sensation to touch is normal in the legs. Her hair is thicker, shinier, and moister.   Breasts: The right breast in Tanner stage 1.5. The right areola is still 40 mm in long axis. The left breast is Tanner stage 1.0.  The left areola measures 35 mm in long axis,compared to 32 mm last  November. I do not feel breast buds on either side.   Laboratory data:  HbA1c today was 6.1%, compared with 5.8% on 12/24/13 and with 5.2% at the visit prior.   Labs 07/21/14: CMP normal; estradiol 30.2, FSH 64.1, LH 30.1, testosterone 41; TSH 3.596, free T4 1.75, free T3 3.3; PTH 42, calcium 9.4, 215-hydroxy vitamin d 33  Labs 12/24/13: TSH 0.768, free T4 1.42, free T3 3.0; estradiol 52.3, FSH 26.6, LH 10.3, testosterone 46; CMP normal   Labs 08/07/13: BMP normal, with calcium 9.5; estradiol 22.7; FSH 46.7, LH 18.3; TSH 1.787, free T4 1.63, free T3 3.1; testosterone 43   Labs 05/03/13: Estradiol 17.5, testosterone 66, free testosterone 14.9, LH 44.8, FSH 91.4, 25-hydroxy vitamin D 32, 1,25-dihydroxy vitamin D 55  Labs 01/18/13: BMP normal, to include calcium of 9.9; TSH 1.156, free T4 1.51, free T3 3.1; testosterone 46 (increased from 33.05), estradiol 16.2 (increased from < 11.8); LH 34.4 (decreased from 36.5), FSH 86.3 (decreased from 106.1); PTH 36.8, phosphorus 4.0, 25-hydroxy vitamin D 27   Labs 10/12/12: TSH 2.079, free T4 1.44, free T3 2.9, LH 36.5,  FSH 106.2, estradiol <11.8, Testosterone 29.94, calcium 9.8, phosphorus 4.5, 25-vitamin D 29,   Labs 05/09/12: TSH 0.351, free T4 1.73, free T3 3.1   Labs 03/05/12: TSH 3.236, free T4 1.60, free T3 3.1   Labs 12/14/11: TSH 3.8, free T4 1.39, free T3 3.3, TPO 381, LH 33.4, FSH 86.9, testosterone 33.5, free testosterone 7.2 (normal 1.0-5.0)  Estradiol 13.6,                Labs 08/22/11: TSH 3.183, free T4 1.40, free T3 3.4. testosterone 34.45, estradiol 14.8     Buccal smear - normal  IMAGING: BMD 05/29/13: BMD in spine was > 50%. BMD in the hip was above the 95%. Z-scores were 0.3 and 1.68 respectively.   ASSESSMENT: 1. Hypothyroidism: The patient is again hypothyroid this month. She needs a small increase in Synthroid to 88 mcg/day.   2. Hashimoto's thyroiditis: Her Hashimoto's disease is clinically quiescent, but intermittently active. The  pattern of all 3 TFTS shifting together in the same direction, upward or downward, is pathognomonic for a recent flare up of Hashimoto's thyroiditis.  3. Goiter: The thyroid gland is slightly larger today. The waxing and waning of thyroid gland size is c/w evolving Hashimoto's disease.  4. Primary Amenorrhea/Congenital hypoplastic uterus:   A. When she became euthyroid, she had increases in [facility] and [place] into the menopausal range. Her testosterone and estradiol have also increased in the past 2 years. Some of these increases may have been due to increased ovarian production of these  hormones. Some of the increased testosterone may have been caused by the effects of overly fat adipose cell cytokines causing insulin resistance and hyperinsulinemia, the hyperinsulinemia in turn causing elevated testosterone. Some of the increased estradiol may have been due to increased aromatization of testosterone by adipose cells. Although her estradiol and testosterone had increased, so too had her FSH and LH.   B. When we began estrogen replacement therapy, our immediate estrogen replacement goals were three-fold:   1. To promote estrogenization of breasts and vaginal tissues   2. To promote feminization of her brain and body overall   3. To improve bone mineral density   C. Lethia has responded fairly well to Felicity. Her areolae have increased a bit in size at last visit. Her estradiol and testosterone had increased much more in March 201, but have since decreased.Marland Kitchen Her [place] and LH had decreased quite a bit, but have increased again as her estradiol and testosterone decreased. She says that she is applying the patch every 3-4 days as prescribed. Dad concurs.I am not sure what the cause of these recent hormonal changes might be.    D. We will increase her ose of mini-Vivelle today.  As suggested by Dr. Melina Fiddler, we will slowly increase her estrogen doses over a 1-2 year period. We will reassess her clinical exam and  lab tests every 3-4 months. We will also want to reassess her pelvic US in about 8 months. 5. Obesity: Patient's weight has decreased. She has been exercising amore recently. She still needs to burn off more calories than she takes in.  6. Developmental delays: As noted in my November 2012 note, the patient had an IQ test which gave her an IQ of 69. In my past visits with her, she seemed to be more mentally retarded than that, but she has been a bit more interactive and engaged the past two visits.   7. GERD: Her GERD and dyspepsia are well-controlled with omeprazole. At this point she shows no signs of tachyphylaxis.  8. Kyphosis: Her repeat scoliosis series did not show progression. In fact, there was a suggestion of improvement. 9. Hypertension: Her systolic BP is fine. Her diastolic BP is still high.   10.Pre-diabetes: Her HbA1c is now in the pre-diabetic range, despite a 4 pound weight loss.   PLAN: 1. Diagnostic: Repeat the  TFTs, testosterone, estradiol, LH, and FSH, plus 25-hydroxy vitamin D, calcium, and PTH in 4 months.  2. Therapeutic: Increase the mini-Vivelle to 0.05 mg patch every 3-4 days.  Increase Synthroid to 88 mcg per day per day. Continue omeprazole, 20 mg, twice daily. Take one MVI or Citracal-D, twice daily.   3. Patient education: We discussed the fact that we started ERT in order to promote feminization and to achieve as much bone mineralization as possible. The uterus may or may not grow. We also discussed the possible need for hysterectomy and/or gonadectomy to remove any possible testicular tissue. 4. Follow-up: Follow-up visit in 4 months.  Level of Service: This visit lasted in excess of 60 minutes. More than 50% of the visit was devoted to counseling.  Sherrlyn Hock

## 2014-07-28 NOTE — Patient Instructions (Signed)
Follow up visit in 4 months. Please repeat lab tests 1-2 weeks prior to your next visit.

## 2014-11-04 ENCOUNTER — Other Ambulatory Visit: Payer: Self-pay | Admitting: *Deleted

## 2014-11-04 DIAGNOSIS — E669 Obesity, unspecified: Secondary | ICD-10-CM

## 2014-11-24 ENCOUNTER — Other Ambulatory Visit: Payer: Self-pay | Admitting: "Endocrinology

## 2014-11-25 LAB — TESTOSTERONE, FREE, TOTAL, SHBG
Sex Hormone Binding: 21 nmol/L (ref 17–124)
Testosterone, Free: 9.4 pg/mL — ABNORMAL HIGH (ref 0.6–6.8)
Testosterone-% Free: 2.3 % (ref 0.4–2.4)
Testosterone: 41 ng/dL (ref 10–70)

## 2014-11-25 LAB — PTH, INTACT AND CALCIUM
Calcium: 9.4 mg/dL (ref 8.4–10.5)
PTH: 49 pg/mL (ref 14–64)

## 2014-11-25 LAB — LUTEINIZING HORMONE: LH: 20.4 m[IU]/mL

## 2014-11-25 LAB — T3, FREE: T3, Free: 3.5 pg/mL (ref 2.3–4.2)

## 2014-11-25 LAB — TSH: TSH: 0.449 u[IU]/mL (ref 0.350–4.500)

## 2014-11-25 LAB — VITAMIN D 25 HYDROXY (VIT D DEFICIENCY, FRACTURES): Vit D, 25-Hydroxy: 22 ng/mL — ABNORMAL LOW (ref 30–100)

## 2014-11-25 LAB — T4, FREE: Free T4: 1.54 ng/dL (ref 0.80–1.80)

## 2014-11-25 LAB — HEMOGLOBIN A1C
Hgb A1c MFr Bld: 5.7 % — ABNORMAL HIGH (ref <5.7)
Mean Plasma Glucose: 117 mg/dL — ABNORMAL HIGH (ref <117)

## 2014-11-25 LAB — CALCIUM: Calcium: 9.2 mg/dL (ref 8.4–10.5)

## 2014-11-25 LAB — ESTRADIOL: Estradiol: 34.4 pg/mL

## 2014-11-25 LAB — FOLLICLE STIMULATING HORMONE: FSH: 51.2 m[IU]/mL

## 2014-12-01 ENCOUNTER — Ambulatory Visit: Payer: BC Managed Care – PPO | Admitting: "Endocrinology

## 2014-12-09 ENCOUNTER — Encounter: Payer: Self-pay | Admitting: "Endocrinology

## 2014-12-09 ENCOUNTER — Ambulatory Visit (INDEPENDENT_AMBULATORY_CARE_PROVIDER_SITE_OTHER): Payer: BLUE CROSS/BLUE SHIELD | Admitting: "Endocrinology

## 2014-12-09 VITALS — BP 136/84 | HR 61 | Wt 184.0 lb

## 2014-12-09 DIAGNOSIS — I10 Essential (primary) hypertension: Secondary | ICD-10-CM

## 2014-12-09 DIAGNOSIS — R7303 Prediabetes: Secondary | ICD-10-CM

## 2014-12-09 DIAGNOSIS — E038 Other specified hypothyroidism: Secondary | ICD-10-CM

## 2014-12-09 DIAGNOSIS — R7309 Other abnormal glucose: Secondary | ICD-10-CM

## 2014-12-09 DIAGNOSIS — E559 Vitamin D deficiency, unspecified: Secondary | ICD-10-CM

## 2014-12-09 DIAGNOSIS — E049 Nontoxic goiter, unspecified: Secondary | ICD-10-CM

## 2014-12-09 DIAGNOSIS — E669 Obesity, unspecified: Secondary | ICD-10-CM

## 2014-12-09 DIAGNOSIS — E063 Autoimmune thyroiditis: Secondary | ICD-10-CM

## 2014-12-09 DIAGNOSIS — Q51811 Hypoplasia of uterus: Secondary | ICD-10-CM

## 2014-12-09 DIAGNOSIS — N910 Primary amenorrhea: Secondary | ICD-10-CM

## 2014-12-09 NOTE — Progress Notes (Signed)
Chief complaint: Follow-up of primary amenorrhea, congenital hypoplastic uterus, acquired autoimmune hypothyroidism, Hashimoto's thyroiditis, goiter, kyphosis, scoliosis, osteopenia, s/p repair of double outlet right ventricle, and gastroesophageal reflux disease  History of present illness: The patient is a 24 year-old Caucasian young woman. She was accompanied by her father.  1. We have followed this young woman since 10/28/2010 when she was referred by Dr. Crawford Givens, [facility] for evaluation of hypothyroidism and primary amenorrhea.   A. At age 15 months the child was noted to be somewhat slow to walk. She was referred to a pediatric neurologist, Dr. Wyline Copas, for further evaluation. Dr. Gaynell Face noted some minor gross motor and fine motor delays, but also noted a significant cardiac murmur. 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 After several years of follow-up, the patient underwent corrective surgery in 2009. She has done well clinically since that time.  B. 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 The geneticist noted the presence of a scoliosis and some limb length discrepancy. She had flattening of the left occipital region as compared to the right. Her scalp hair was thin, especially sparse in the temporal regions. The forehead was flat. She had short palpebral fissures and a wide nasal bridge. Epicanthal folds were present. She had a thin upper lip and some underbite. She also had facial asymmetry with the right cheek being more prominent than the left. The lower lip was also more prominent on the right than on the left. The neck was short and wide. The nipples were felt to be widely spaced. Labia majora were  slightly hypoplastic. She had mild soft tissue syndactyly between the third and fourth digits bilaterally. The right lower extremity was longer than the left. The right foot was longer than the left foot. The right thigh was somewhat longer than the left thigh. The right leg was longer in length compared to the left. All of the right toes were larger than the left toes. Left nails were smaller and dystrophic. She also had a thoracolumbar scoliosis with convexity to the right. She walked with a tilt of her pelvis to the left side. 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 Because the clinical findings suggested the possibility of a genetic disorder, a karyotype was obtained. She had a 69, XX karyotype. A FISH study was also performed. This study showed no evidence for the common form of DiGeorge Syndrome. The geneticist was not able to make a specific genetic diagnosis. Although the geneticist requested that the family return for a follow-up examination, it appears that did not happen.  C. On 09/03/10 the patient was evaluated at [place]. The reason for referral was primary amenorrhea at age 28. The patient's breasts were noted to be Tanner stage III. The vagina looked normal externally. The patient was unable to tolerate either a speculum exam or pelvic exam. Pregnancy test was negative. Laboratory data showed a TSH of 9.296, free T4 0.91, and T3 of 126.5. Her TSH was definitely elevated. The free T4 and T3 were in the lower portion of the normal range. FSH was very elevated at  92.5. Prolactin was 4.2. Repeat karyotype was 26, XX. A pelvic ultrasound was performed in the [facility] office.  The uterus and ovaries were not clearly seen. Endometrial stripe was not seen. It was commented that additional imaging may be necessary.  D. On 10/28/10 the patient was seen by our physician assistant, who obtained additional history to  include past issues with allergies, asthma, and gastroesophageal reflux disease. At that point the patient was taking Singulair daily, Advair daily, omeprazole daily, and cetirizine (Zyrtec) as needed. On examination the patient weighed 148 pounds which was at the 80th percentile. Her height was at the 8th percentile. Her blood pressure was 124/80 and heart rate was 68. She did have somewhat dysmorphic facies. A mildly enlarged, but diffusely enlarged, nontender goiter was noted. Laboratory data showed a TSH of 7.517, free T4 of 1.02, and free T3 of 3.4. FSH was 90 and LH was 35. Testosterone was 23.56. Estradiol was 17.2. She definitely had both primary hypogonadism and primary hypothyroidism. At that point she was started on Synthroid, 25 mcg per day.  2. Upon our physician assistant's departure at the end of March 2012, the patient was rescheduled to see me on 01/31/2011. She was taking her Synthroid 25, mcg per day at that time. Her goiter was approximately 20-25 g in size. The thyroid was non-tender. She was still amenorrheic. Thyroid tests done on that day showed a TSH of 1.667, free T4  1.26, and free T3 3.5. These tests were mid-range normal. Her TPO antibody was elevated at 299, c/w Hashimoto's thyroiditis. She was trying to follow our  Eat Right Diet plan. She was also walking at least an hour per day. She had lost 16 pounds. At her visit on 08/30/11, I met with the patient and her father.The patient was then taking Synthroid, 37.5 mcg/day.  I learned that her mother was not attending the visits in our clinic on the building's third floor because the mother was very afraid of heights and elevators. I made arrangements to see the patient and parents in the first floor radiology conference room on 09/13/11. Dr. Janeal Holmes, MD, PhD, our staff geneticist, joined me on that visit and we provided education to the family about possible Turner's Syndrome mosaicism, hypothyroidism, and Hashimoto's diease.    3. On 03/15/12 Dr. Abelina Bachelor and I contacted Dr. Freddy Jaksch, pediatric endocrinologist at [facility] and a nationally recognized expert on Turner's Syndrome. We presented Carolyn [name]'s case to Dr. Melina Fiddler. Although Dr. Melina Fiddler agreed that Carolyn [name] likely had a genetic basis for her complex congenital heart disease and other issues, Dr. Melina Fiddler did not feel that Tajanae was a Turner's mosaic, but she could not identify any other recognized syndrome that would fit [place]. When I asked Dr. Melina Fiddler her opinion as to whether or not we need to surgically define the gonads before beginning hormone replacement therapy, Dr. Melina Fiddler stated that we probably did not need to do so, but she did understand my concern that if an ovotestis or ovotestes were present, it/they should be removed due to the risk of developing cancer. Dr. Melina Fiddler suggested that when we are ready to start hormone replacement therapy we use Vivelle-Dot transdermal estradiol, beginning with a dose of 25 mcg/day and gradually increasing the dose to 100 mcg/day over 1-2 years time. We should then wait for spontaneous occurrence of menses.  We could then add a progestin, such as Prometrium, on days 1-10 of each month.     4. In succeeding months her Hashimoto's disease T lymphocytes have gradually but progressively destroyed more thyrocytes. In response, we have gradually increased the patient's Synthroid dosage  to 75 mcg/day in order to keep her euthyroid.   5. The patient's last [facility] clinic visit was on 07/18/14. I increased her on mini-Vivelle estradiol patch to the 0.05 strength every 3-4 days. She has not noticed any changes in breast size, breast tenderness, or vaginal secretions. I also increased her Synthroid to 88 mcg/day. In the interim, she has been healthy. Her allergies have not been acting up. She continues taking omeprazole, 20 mg, twice daily. She also takes a MVI per day, and Citracal-D once per day.  6. Pertinent Review of  Systems: Constitutional: The patient feels "good". She is healthy and has no significant complaints. Eyes: Vision is good. There are no significant eye complaints. Neck: The patient has no complaints of anterior neck swelling, soreness, tenderness,  pressure, discomfort, or difficulty swallowing.  Heart: The patient has no complaints of palpitations, irregular heat beats, chest pain, or chest pressure. She saw her [facility] pediatric cardiologist, Dr Ermalene Searing, in mid-October 2014. He gave hear a clean bill of health. Her next scheduled FU will be in 5 years. Gastrointestinal: Bowel movents seem normal. The patient has no complaints of excessive hunger, acid reflux, upset stomach, stomach aches or pains, diarrhea, or constipation. Legs: Muscle mass and strength seem normal. There are no complaints of numbness, tingling, burning, or pain. No edema is noted. Feet: There are no obvious foot problems. There are no complaints of numbness, tingling, burning, or pain. No edema is noted. GYN: The patient has not had any significant increase in breast development or pubertal development. Hair: She says that she is still losing hair, but is not having any bare spots.    PAST MEDICAL, FAMILY, AND SOCIAL HISTORY:  1. School and Family: She is not in school anymore. She tends to snack a lot late at night. Her mother is being treated with chemotherapy for lung cancer. 2. Activities: She walks 30-60 minutes daily when the weather is good.   3. Tobacco, alcohol, and illicit drugs: None 4. PCP: Dr. Saddie Benders 5. GYN: Dr. Gwynneth Munson Dillard 6. Allergy: Dr. Orinda Kenner.saw her the week prior to her last visit and discontinued her Qvar. Francesa no longer needs the Qvar, Advair, or albuterol MDI. 7. Genetics: Dr. Janeal Holmes  REVIEW OF SYSTEMS: There are no other significant problems involving her other body systems.  PHYSICAL EXAM: BP 136/84 mmHg  Pulse 61  Wt 184 lb (83.462 kg) She has gained 8 pounds since  last visit.  Constitutional: The patient looks healthy and appears physically and emotionally well.  Although she is alert and pleasant, she does not initiate much conversation. She did laugh at my jokes today.  Her insight appears to be about as limited as it was at our last visit.   Facies remain asymmetric/dysmorphic. The mouth is somewhat twisted rotationally.  Eyes: There is no arcus or proptosis.  Mouth: The oropharynx appears normal. The tongue appears normal. There is normal oral moisture. There is no obvious gingivitis. Neck: There are no bruits present. The thyroid gland is low-lying.The thyroid gland is a bit smaller today at about 21-22 grams in size. The isthmus is normal today. The lobes are very slightly, but symmetrically, enlarged. The consistency of the thyroid gland is normal. There is no thyroid tenderness to palpation. Lungs: The lungs are clear. Air movement is good. Heart: The heart rhythm and rate appear normal. Heart sounds S1 and S2 are normal. I do not appreciate any pathologic heart murmurs.  Abdomen: The abdomen is more  enlarged today. Bowel sounds are normal. The abdomen is soft and non-tender. There is no obviously palpable hepatomegaly, splenomegaly, or other masses.  Arms: Muscle mass appears appropriate for age.  Hands: There is no obvious tremor. Phalangeal and metacarpophalangeal joints appear normal. Palms are normal. Legs: Muscle mass appears appropriate for age. There is no edema.  Neurologic: Muscle strength is normal for age and gender  in both the upper and the lower extremities. Muscle tone appears normal. Sensation to touch is normal in the legs. Hair: Her hair is thin at the vertex.   Breasts: The right breast in Tanner stage 1.8. The right areola is more prominent and larger at 42 mm. The left breast is Tanner stage 1.0.  The left areola measures 35 mm in long axis, compared to 35 mm last November. I do not feel breast buds on either side.   Laboratory  data:     Labs 11/24/14: HbA1c was 5.7% today, compared with 6.1% at last visit and with 5.8% at the visit prior; calcium 9.2, PTH 49; 25-OH vitamin D 22; TSH 0.449, free T4 1.54, free T3 3.5; testosterone 41, estradiol 34.4  Labs 07/21/14: CMP normal; estradiol 30.2, FSH 64.1, LH 30.1, testosterone 41; TSH 3.596, free T4 1.75, free T3 3.3; PTH 42, calcium 9.4, 215-hydroxy vitamin D 33  Labs 12/24/13: TSH 0.768, free T4 1.42, free T3 3.0; estradiol 52.3, FSH 26.6, LH 10.3, testosterone 46; CMP normal   Labs 08/07/13: BMP normal, with calcium 9.5; estradiol 22.7; FSH 46.7, LH 18.3; TSH 1.787, free T4 1.63, free T3 3.1; testosterone 43   Labs 05/03/13: Estradiol 17.5, testosterone 66, free testosterone 14.9, LH 44.8, FSH 91.4, 25-hydroxy vitamin D 32, 1,25-dihydroxy vitamin D 55  Labs 01/18/13: BMP normal, to include calcium of 9.9; TSH 1.156, free T4 1.51, free T3 3.1; testosterone 46 (increased from 33.05), estradiol 16.2 (increased from < 11.8); LH 34.4 (decreased from 36.5), FSH 86.3 (decreased from 106.1); PTH 36.8, phosphorus 4.0, 25-hydroxy vitamin D 27   Labs 10/12/12: TSH 2.079, free T4 1.44, free T3 2.9, LH 36.5,  FSH 106.2, estradiol <11.8, Testosterone 29.94, calcium 9.8, phosphorus 4.5, 25-vitamin D 29,   Labs 05/09/12: TSH 0.351, free T4 1.73, free T3 3.1   Labs 03/05/12: TSH 3.236, free T4 1.60, free T3 3.1   Labs 12/14/11: TSH 3.8, free T4 1.39, free T3 3.3, TPO 381, LH 33.4, FSH 86.9, testosterone 33.5, free testosterone 7.2 (normal 1.0-5.0)  Estradiol 13.6,                Labs 08/22/11: TSH 3.183, free T4 1.40, free T3 3.4. testosterone 34.45, estradiol 14.8     Buccal smear - normal  IMAGING: BMD 05/29/13: BMD in spine was > 50%. BMD in the hip was above the 95%. Z-scores were 0.3 and 1.68 respectively.   ASSESSMENT: 1. Hypothyroidism: The patient is euthyroid this month on her Synthroid dose of 88 mcg/day.    2. Hashimoto's thyroiditis: Her Hashimoto's disease is clinically  quiescent, but intermittently active. The pattern of all 3 TFTS shifting together in the same direction, upward or downward, is pathognomonic for a recent flare up of Hashimoto's thyroiditis.  3. Goiter: The thyroid gland is smaller today. The waxing and waning of thyroid gland size is c/w evolving Hashimoto's disease.  4. Primary Amenorrhea/Congenital hypoplastic uterus:   A. When she became euthyroid, she had increases in [facility] and [place] into the menopausal range. Her testosterone and estradiol have also increased in the past 2 years.  Some of these increases may have been due to increased ovarian production of these hormones. Some of the increased testosterone may have been caused by the effects of overly fat adipose cell cytokines causing insulin resistance and hyperinsulinemia, the hyperinsulinemia in turn causing elevated testosterone. Some of the increased estradiol may have been due to increased aromatization of testosterone by adipose cells. Although her estradiol and testosterone had increased, so too had her FSH and LH.   B. When we began estrogen replacement therapy, our immediate estrogen replacement goals were three-fold:   1. To promote estrogenization of breasts and vaginal tissues   2. To promote feminization of her brain and body overall   3. To improve bone mineral density   C. Carolyn [name] has responded fairly well to [place]. Her areolae have increased a bit in size since last visit. Her estradiol and testosterone had increased much more in March 2015, but have since decreased. Her testosterone is unchanged from her last visit. Her estradiol is a bit higher.   D. We will increase her dose of mini-Vivelle today.  As suggested by Dr. Melina Fiddler, we will slowly increase her estrogen doses over a 1-2 year period. We will reassess her clinical exam and lab tests every 3-4 months.  5. Obesity: Patient's weight has increased. She has not been exercising much lately. She still needs to burn off more  calories than she takes in.  6. Developmental delays: As noted in my November 2012 note, the patient had an IQ test which gave her an IQ of 58. In my past visits with her, she seemed to be more mentally retarded than that, but she has been a bit more interactive and engaged the past two visits.   7. GERD: Her GERD and dyspepsia are well-controlled with omeprazole. At this point she shows no signs of tachyphylaxis.  8. Kyphosis: Her repeat scoliosis series did not show progression. In fact, there was a suggestion of improvement. 9. Hypertension: Her systolic BP is fine. Her diastolic BP is still high. Resuming exercise will help.  10. Pre-diabetes: Her HbA1c is now at the low end of the pre-diabetic range. Eating right , exercise, and weight loss will help.  11. Vitamin D deficiency disease: She needs more vitamin D.  PLAN: 1. Diagnostic: Repeat the testosterone, estradiol, LH, and FSH, plus 25-hydroxy vitamin D, calcium, and PTH in 4 months.  2. Therapeutic: Increase the mini-Vivelle to 0.075 mg patch every 3-4 days.  Continue Synthroid at 88 mcg per day per day. Continue omeprazole, 20 mg, twice daily. Take one MVI or Citracal-D, twice daily.  Add Biotek one 50,00 IU capsule once a week. 3. Patient education: We discussed the fact that we started ERT in order to promote feminization and to achieve as much bone mineralization as possible. The uterus may or may not grow. We also discussed the possible need for hysterectomy and/or gonadectomy to remove any possible testicular tissue. 4. Follow-up: Follow-up visit in 4 months.  Level of Service: This visit lasted in excess of 50 minutes. More than 50% of the visit was devoted to counseling.  Sherrlyn Hock

## 2014-12-09 NOTE — Patient Instructions (Signed)
Follow up visit in 4 months. Please repeat labs one week prior.

## 2014-12-12 DIAGNOSIS — E559 Vitamin D deficiency, unspecified: Secondary | ICD-10-CM | POA: Insufficient documentation

## 2014-12-12 DIAGNOSIS — R7303 Prediabetes: Secondary | ICD-10-CM | POA: Insufficient documentation

## 2015-03-06 ENCOUNTER — Other Ambulatory Visit: Payer: Self-pay | Admitting: "Endocrinology

## 2015-03-10 ENCOUNTER — Other Ambulatory Visit: Payer: Self-pay | Admitting: "Endocrinology

## 2015-04-02 ENCOUNTER — Other Ambulatory Visit: Payer: Self-pay | Admitting: *Deleted

## 2015-04-02 DIAGNOSIS — E034 Atrophy of thyroid (acquired): Secondary | ICD-10-CM

## 2015-04-03 LAB — HEMOGLOBIN A1C
Hgb A1c MFr Bld: 6 % — ABNORMAL HIGH (ref <5.7)
Mean Plasma Glucose: 126 mg/dL — ABNORMAL HIGH (ref <117)

## 2015-04-03 LAB — TSH: TSH: 2.152 u[IU]/mL (ref 0.350–4.500)

## 2015-04-03 LAB — TESTOSTERONE, FREE, TOTAL, SHBG
Sex Hormone Binding: 19 nmol/L (ref 17–124)
Testosterone, Free: 8.8 pg/mL — ABNORMAL HIGH (ref 0.6–6.8)
Testosterone-% Free: 2.4 % (ref 0.4–2.4)
Testosterone: 37 ng/dL (ref 10–70)

## 2015-04-03 LAB — CALCIUM: Calcium: 9.5 mg/dL (ref 8.4–10.5)

## 2015-04-03 LAB — T3, FREE: T3, Free: 2.9 pg/mL (ref 2.3–4.2)

## 2015-04-03 LAB — ESTRADIOL: Estradiol: 84.8 pg/mL

## 2015-04-03 LAB — PTH, INTACT AND CALCIUM
Calcium: 9.5 mg/dL (ref 8.4–10.5)
PTH: 36 pg/mL (ref 14–64)

## 2015-04-03 LAB — VITAMIN D 25 HYDROXY (VIT D DEFICIENCY, FRACTURES): Vit D, 25-Hydroxy: 20 ng/mL — ABNORMAL LOW (ref 30–100)

## 2015-04-03 LAB — FOLLICLE STIMULATING HORMONE: FSH: 13 m[IU]/mL

## 2015-04-03 LAB — LUTEINIZING HORMONE: LH: 6.6 m[IU]/mL

## 2015-04-03 LAB — T4, FREE: Free T4: 1.31 ng/dL (ref 0.80–1.80)

## 2015-04-09 ENCOUNTER — Ambulatory Visit (INDEPENDENT_AMBULATORY_CARE_PROVIDER_SITE_OTHER): Payer: BLUE CROSS/BLUE SHIELD | Admitting: "Endocrinology

## 2015-04-09 ENCOUNTER — Encounter: Payer: Self-pay | Admitting: "Endocrinology

## 2015-04-09 VITALS — BP 129/86 | HR 64 | Wt 188.0 lb

## 2015-04-09 DIAGNOSIS — E063 Autoimmune thyroiditis: Secondary | ICD-10-CM

## 2015-04-09 DIAGNOSIS — R7303 Prediabetes: Secondary | ICD-10-CM

## 2015-04-09 DIAGNOSIS — E049 Nontoxic goiter, unspecified: Secondary | ICD-10-CM | POA: Diagnosis not present

## 2015-04-09 DIAGNOSIS — Q51811 Hypoplasia of uterus: Secondary | ICD-10-CM

## 2015-04-09 DIAGNOSIS — E559 Vitamin D deficiency, unspecified: Secondary | ICD-10-CM

## 2015-04-09 DIAGNOSIS — N910 Primary amenorrhea: Secondary | ICD-10-CM | POA: Diagnosis not present

## 2015-04-09 DIAGNOSIS — E038 Other specified hypothyroidism: Secondary | ICD-10-CM | POA: Diagnosis not present

## 2015-04-09 DIAGNOSIS — R7309 Other abnormal glucose: Secondary | ICD-10-CM

## 2015-04-09 DIAGNOSIS — I10 Essential (primary) hypertension: Secondary | ICD-10-CM

## 2015-04-09 DIAGNOSIS — E669 Obesity, unspecified: Secondary | ICD-10-CM

## 2015-04-09 MED ORDER — ESTRADIOL 0.1 MG/24HR TD PTTW: 1.0000 | MEDICATED_PATCH | TRANSDERMAL | Status: DC

## 2015-04-09 NOTE — Progress Notes (Signed)
Chief complaint: Follow-up of primary amenorrhea, congenital hypoplastic uterus, acquired autoimmune hypothyroidism, Hashimoto's thyroiditis, goiter, kyphosis, scoliosis, osteopenia, s/p repair of double outlet right ventricle, and gastroesophageal reflux disease  History of present illness: The patient is a 24 year-old Caucasian young woman. She was accompanied by her father.  1. We have followed this young woman since 10/28/2010 when she was referred by Dr. Crawford Givens, [facility] for evaluation of hypothyroidism and primary amenorrhea.   A. At age 15 months the child was noted to be somewhat slow to walk. She was referred to a pediatric neurologist, Dr. Wyline Copas, for further evaluation. Dr. Gaynell Face noted some minor gross motor and fine motor delays, but also noted a significant cardiac murmur. 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 After several years of follow-up, the patient underwent corrective surgery in 2009. She has done well clinically since that time.  B. 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 The geneticist noted the presence of a scoliosis and some limb length discrepancy. She had flattening of the left occipital region as compared to the right. Her scalp hair was thin, especially sparse in the temporal regions. The forehead was flat. She had short palpebral fissures and a wide nasal bridge. Epicanthal folds were present. She had a thin upper lip and some underbite. She also had facial asymmetry with the right cheek being more prominent than the left. The lower lip was also more prominent on the right than on the left. The neck was short and wide. The nipples were felt to be widely spaced. Labia majora were  slightly hypoplastic. She had mild soft tissue syndactyly between the third and fourth digits bilaterally. The right lower extremity was longer than the left. The right foot was longer than the left foot. The right thigh was somewhat longer than the left thigh. The right leg was longer in length compared to the left. All of the right toes were larger than the left toes. Left nails were smaller and dystrophic. She also had a thoracolumbar scoliosis with convexity to the right. She walked with a tilt of her pelvis to the left side. 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 Because the clinical findings suggested the possibility of a genetic disorder, a karyotype was obtained. She had a 69, XX karyotype. A FISH study was also performed. This study showed no evidence for the common form of DiGeorge Syndrome. The geneticist was not able to make a specific genetic diagnosis. Although the geneticist requested that the family return for a follow-up examination, it appears that did not happen.  C. On 09/03/10 the patient was evaluated at [place]. The reason for referral was primary amenorrhea at age 28. The patient's breasts were noted to be Tanner stage III. The vagina looked normal externally. The patient was unable to tolerate either a speculum exam or pelvic exam. Pregnancy test was negative. Laboratory data showed a TSH of 9.296, free T4 0.91, and T3 of 126.5. Her TSH was definitely elevated. The free T4 and T3 were in the lower portion of the normal range. FSH was very elevated at  92.5. Prolactin was 4.2. Repeat karyotype was 26, XX. A pelvic ultrasound was performed in the [facility] office.  The uterus and ovaries were not clearly seen. Endometrial stripe was not seen. It was commented that additional imaging may be necessary.  D. On 10/28/10 the patient was seen by our physician assistant, who obtained additional history to  include past issues with allergies, asthma, and gastroesophageal reflux disease. At that point the patient was taking Singulair daily, Advair daily, omeprazole daily, and cetirizine (Zyrtec) as needed. On examination the patient weighed 148 pounds which was at the 80th percentile. Her height was at the 8th percentile. Her blood pressure was 124/80 and heart rate was 68. She did have somewhat dysmorphic facies. A mildly enlarged, but diffusely enlarged, nontender goiter was noted. Laboratory data showed a TSH of 7.517, free T4 of 1.02, and free T3 of 3.4. FSH was 90 and LH was 35. Testosterone was 23.56. Estradiol was 17.2. She definitely had both primary hypogonadism and primary hypothyroidism. At that point she was started on Synthroid, 25 mcg per day.  2. Upon our physician assistant's departure at the end of March 2012, the patient was rescheduled to see me on 01/31/2011. She was taking her Synthroid 25, mcg per day at that time. Her goiter was approximately 20-25 grams in size. The thyroid was non-tender. She was still amenorrheic. Thyroid tests done on that day showed a TSH of 1.667, free T4  1.26, and free T3 3.5. These tests were mid-range normal. Her TPO antibody was elevated at 299, c/w Hashimoto's thyroiditis. She was trying to follow our  Eat Right Diet plan. She was also walking at least an hour per day. She had lost 16 pounds. At her visit on 08/30/11, I met with the patient and her father.The patient was then taking Synthroid, 37.5 mcg/day.  I learned that her mother was not attending the visits in our clinic on the building's third floor because the mother was very afraid of heights and elevators. I made arrangements to see the patient and parents in the first floor radiology conference room on 09/13/11. Dr. Janeal Holmes, MD, PhD, our staff geneticist, joined me on that visit and we provided education to the family about possible Turner's Syndrome mosaicism, hypothyroidism, and Hashimoto's  diease.   3. On 03/15/12 Dr. Abelina Bachelor and I contacted Dr. Freddy Jaksch, pediatric endocrinologist at [facility] and a nationally recognized expert on Turner's Syndrome. We presented Jaylean's case to Dr. Melina Fiddler. Although Dr. Melina Fiddler agreed that Ander Purpura likely had a genetic basis for her complex congenital heart disease and other issues, Dr. Melina Fiddler did not feel that Argusta had Turner's mosaicism, but she could not identify any other recognized syndrome that would fit Yaslene. When I asked Dr. Melina Fiddler her opinion as to whether or not we need to surgically define the gonads before beginning hormone replacement therapy, Dr. Melina Fiddler stated that we probably did not need to do so, but she did understand my concern that if an ovotestis or ovotestes were present, it/they should be removed due to the risk of developing cancer. Dr. Melina Fiddler suggested that when we are ready to start hormone replacement therapy we use Vivelle-Dot transdermal estradiol, beginning with a dose of 25 mcg/day and gradually increasing the dose to 100 mcg/day over 1-2 years time. We should then wait for spontaneous occurrence of menses.  We could then add a progestin, such as Prometrium, on days 1-10 of each month.     4. In the succeeding two years Leah's Hashimoto's disease T lymphocytes have gradually but progressively destroyed more thyrocytes. In response, we have gradually increased the patient's Synthroid  dosage to 88 mcg/day in order to keep her euthyroid. We have also been gradually increasing the strength of her  Vivelle-Dot patches.  5. The patient's last [facility] clinic visit was on 12/09/14. I increased her on mini-Vivelle estradiol patch to the 0.075 strength every 3-4 days. She has not noticed any changes in breast size, breast tenderness, or vaginal secretions. I continued her Synthroid of 88 mcg/day. In the interim, she has been healthy. Her allergies have not been acting up. She continues taking omeprazole, 20 mg, twice  daily. She also takes a MVI per day, and Citracal-D once per day.  6. Pertinent Review of Systems: Constitutional: The patient feels "good". She is healthy and has no significant complaints. Eyes: Vision is good. There are no significant eye complaints. Neck: The patient has no complaints of anterior neck swelling, soreness, tenderness,  pressure, discomfort, or difficulty swallowing.  Heart: The patient has no complaints of palpitations, irregular heat beats, chest pain, or chest pressure. She saw her [facility] pediatric cardiologist, Dr Ermalene Searing, in mid-October 2014. He gave hear a clean bill of health. Her next scheduled FU will be in 2019. Gastrointestinal: Bowel movents seem normal. The patient has no complaints of excessive hunger, acid reflux, upset stomach, stomach aches or pains, diarrhea, or constipation. Legs: Muscle mass and strength seem normal. There are no complaints of numbness, tingling, burning, or pain. No edema is noted. Feet: There are no obvious foot problems. There are no complaints of numbness, tingling, burning, or pain. No edema is noted. GYN: The patient has not had any significant increase in breast development or pubertal development. Hair: She says that she is still losing hair, but is not having any bare spots.    PAST MEDICAL, FAMILY, AND SOCIAL HISTORY:  1. School and Family: She is not in school anymore. She tends to snack a lot late at night. Her mother is being treated with Optivo for her worsening lung cancer.  2. Activities: She walks 30-60 minutes daily when the weather is good.   3. Tobacco, alcohol, and illicit drugs: None 4. PCP: Dr. Saddie Benders 5. GYN: Dr. Gwynneth Munson Dillard 6. Allergy: Josaphine no longer needs the Qvar, Advair, or albuterol MDI. 7. Genetics: Dr. Janeal Holmes  REVIEW OF SYSTEMS: There are no other significant problems involving her other body systems.  PHYSICAL EXAM: BP 129/86 mmHg  Pulse 64  Wt 188 lb (85.276 kg) She has gained 4  pounds since last visit.  Constitutional: The patient looks healthy and appears physically and emotionally well.  Although she is alert and pleasant, she does not initiate much conversation. Her insight appears to be about as limited as it was at our last visit.   Facies remain asymmetric/dysmorphic. The mouth is somewhat twisted rotationally.  Eyes: There is no arcus or proptosis.  Mouth: The oropharynx appears normal. The tongue appears normal. There is normal oral moisture. There is no obvious gingivitis. Neck: There are no bruits present. The thyroid gland is low-lying.The thyroid gland is again mildly enlarged at about 21-22 grams in size. The isthmus is normal today. The lobes are very slightly, but symmetrically, enlarged. The consistency of the thyroid gland is normal. There is no thyroid tenderness to palpation. Lungs: The lungs are clear. Air movement is good. Heart: The heart rhythm and rate appear normal. Heart sounds S1 and S2 are normal. I do not appreciate any pathologic heart murmurs.  Abdomen: The abdomen is more enlarged today. Bowel sounds are normal. The abdomen is soft  and non-tender. There is no obviously palpable hepatomegaly, splenomegaly, or other masses.  Arms: Muscle mass appears appropriate for age.  Hands: There is no obvious tremor. Phalangeal and metacarpophalangeal joints appear normal. Palms are normal. Legs: Muscle mass appears appropriate for age. There is no edema.  Neurologic: Muscle strength is normal for age and gender  in both the upper and the lower extremities. Muscle tone appears normal. Sensation to touch is normal in the legs. Hair: Her hair is thin at the vertex, but overall seems to be thicker over time, c/w estrogen effect. . There are no bare spots.  Breasts: The right breast in Tanner stage 1.8. The right areola is more prominent and larger at 50 mm in longest dimension, compared with 42 mm at last visit. . The left breast is Tanner stage 1.0.  The left  areola measures 37 mm in longest dimension, compared with 35 mm at last visit.  I can feel a 2-3 mm right breast bud today, but no left breast bud.    Laboratory data:     Labs 04/02/15: HbA1c was 6.0%. PTH 36, calcium 9.5, 25-OH vitamin D 20; LH 6.6, FSH 13, estradiol 84.8, testosterone 37; TSH 2.152, free T4 1.31, free T3 2.9  Labs 11/24/14: HbA1c was 5.7% today, compared with 6.1% at last visit and with 5.8% at the visit prior; calcium 9.2, PTH 49; 25-OH vitamin D 22; TSH 0.449, free T4 1.54, free T3 3.5; testosterone 41, estradiol 34.4  Labs 07/21/14: CMP normal; estradiol 30.2, FSH 64.1, LH 30.1, testosterone 41; TSH 3.596, free T4 1.75, free T3 3.3; PTH 42, calcium 9.4, 215-hydroxy vitamin D 33  Labs 12/24/13: TSH 0.768, free T4 1.42, free T3 3.0; estradiol 52.3, FSH 26.6, LH 10.3, testosterone 46; CMP normal   Labs 08/07/13: BMP normal, with calcium 9.5; estradiol 22.7; FSH 46.7, LH 18.3; TSH 1.787, free T4 1.63, free T3 3.1; testosterone 43   Labs 05/03/13: Estradiol 17.5, testosterone 66, free testosterone 14.9, LH 44.8, FSH 91.4, 25-hydroxy vitamin D 32, 1,25-dihydroxy vitamin D 55  Labs 01/18/13: BMP normal, to include calcium of 9.9; TSH 1.156, free T4 1.51, free T3 3.1; testosterone 46 (increased from 33.05), estradiol 16.2 (increased from < 11.8); LH 34.4 (decreased from 36.5), FSH 86.3 (decreased from 106.1); PTH 36.8, phosphorus 4.0, 25-hydroxy vitamin D 27   Labs 10/12/12: TSH 2.079, free T4 1.44, free T3 2.9, LH 36.5,  FSH 106.2, estradiol <11.8, Testosterone 29.94, calcium 9.8, phosphorus 4.5, 25-vitamin D 29,   Labs 05/09/12: TSH 0.351, free T4 1.73, free T3 3.1   Labs 03/05/12: TSH 3.236, free T4 1.60, free T3 3.1   Labs 12/14/11: TSH 3.8, free T4 1.39, free T3 3.3, TPO 381, LH 33.4, FSH 86.9, testosterone 33.5, free testosterone 7.2 (normal 1.0-5.0)  Estradiol 13.6,                Labs 08/22/11: TSH 3.183, free T4 1.40, free T3 3.4. testosterone 34.45, estradiol 14.8      Buccal smear - normal  IMAGING: BMD 05/29/13: BMD in spine was > 50%. BMD in the hip was above the 95%. Z-scores were 0.3 and 1.68 respectively.   ASSESSMENT: 1. Hypothyroidism: The patient is euthyroid again this month on her Synthroid dose of 88 mcg/day. The fact that her TSH is slowly increasing indicates ongoing loss of thyrocytes due to her Hashimoto's disease.  2. Hashimoto's thyroiditis: Her Hashimoto's disease is clinically quiescent, but intermittently active. The pattern of all 3 TFTS shifting in parallel together  in the same direction, upward or downward, that occurred between October 2014 and March 2015, is pathognomonic for having had an interim flare up of Hashimoto's thyroiditis.  3. Goiter: The thyroid gland is still mildly enlarged today. The waxing and waning of thyroid gland size is also c/w evolving Hashimoto's disease.  4. Primary Amenorrhea/Congenital hypoplastic uterus:   A. When she became euthyroid, she had increases in [facility] and [place] into the menopausal range. Her testosterone and estradiol have also increased in the past 2 years. Some of these increases may have been due to increased ovarian production of these hormones. Some of the increased testosterone may have been caused by the effects of overly fat adipose cell cytokines causing insulin resistance and hyperinsulinemia, the hyperinsulinemia in turn causing elevated testosterone. Some of the increased estradiol may have been due to increased aromatization of testosterone by adipose cells. Although her estradiol and testosterone had increased, so too had her FSH and LH.   B. When we began estrogen replacement therapy, our immediate estrogen replacement goals were three-fold:   1. To promote estrogenization of breasts and vaginal tissues   2. To promote feminization of her brain and body overall   3. To improve bone mineral density   C. Breezy has responded fairly well to [place]. Her areolae have increased in size since  last visit. Her LH and FSH have decreased significantly and her estradiol has increased significantly. Her testosterone hs decreased a bit, c/w less LH and FSH tropic effect.   D. We will increase her dose of mini-Vivelle again today.  As suggested by Dr. Melina Fiddler, we will slowly increase her estrogen doses over a 1-2 year period. We will reassess her clinical exam and lab tests every 3-4 months.  5. Obesity: Patient's weight has increased. She has not been exercising much lately. She still needs to burn off more calories than she takes in.  6. Developmental delays: As noted in my November 2012 note, the patient had an IQ test which gave her an IQ of 70. In today's visit and in the past two visits  her interactions are c/w that IQ or perhaps a somewhat lower IQ.    7. GERD: Her GERD and dyspepsia are well-controlled with omeprazole. At this point she shows no signs of tachyphylaxis.  8. Kyphosis: Her repeat scoliosis series did not show progression. In fact, there was a suggestion of improvement. 9. Hypertension: Her systolic BP is fine. Her diastolic BP is still high. Resuming exercise will help.  10. Pre-diabetes: Her HbA1c is now in the pre-diabetic range. Eating right , exercise, and weight loss will help.  11. Vitamin D deficiency disease: She needs more vitamin D.  PLAN: 1. Diagnostic: Repeat the testosterone, estradiol, LH, and FSH, plus 25-hydroxy vitamin D, calcium, and PTH in 4 months.  2. Therapeutic: Increase the mini-Vivelle to 0.10 mg patch every 3-4 days.  Continue Synthroid at 88 mcg per day per day. Continue omeprazole, 20 mg, twice daily. Take one Citracal-D, twice daily, at lunch and dinner.  Add Biotek one 50,00 IU capsule once a week. 3. Patient education: We discussed the fact that we started ERT in order to promote feminization and to achieve as much bone mineralization as possible. The uterus may or may not grow. We also discussed the possible need for hysterectomy and/or  gonadectomy to remove any possible testicular tissue. 4. Follow-up: Follow-up visit in 4 months.  Level of Service: This visit lasted in excess of 45 minutes. More than 50%  of the visit was devoted to counseling.  Sherrlyn Hock

## 2015-04-09 NOTE — Patient Instructions (Signed)
Follow up visit in 4 months. Please repeat lab tests on week prior to next appointment.

## 2015-06-15 ENCOUNTER — Other Ambulatory Visit: Payer: Self-pay | Admitting: *Deleted

## 2015-06-15 DIAGNOSIS — E063 Autoimmune thyroiditis: Secondary | ICD-10-CM

## 2015-06-15 MED ORDER — LEVOTHYROXINE SODIUM 88 MCG PO TABS: ORAL_TABLET | ORAL | Status: DC

## 2015-07-16 ENCOUNTER — Other Ambulatory Visit: Payer: Self-pay | Admitting: "Endocrinology

## 2015-08-11 LAB — TSH: TSH: 0.942 u[IU]/mL (ref 0.350–4.500)

## 2015-08-11 LAB — PTH, INTACT AND CALCIUM
Calcium: 9.3 mg/dL (ref 8.4–10.5)
PTH: 44 pg/mL (ref 14–64)

## 2015-08-11 LAB — TESTOSTERONE, FREE, TOTAL, SHBG
Sex Hormone Binding: 24 nmol/L (ref 17–124)
Testosterone, Free: 14.9 pg/mL — ABNORMAL HIGH (ref 0.6–6.8)
Testosterone-% Free: 2.2 % (ref 0.4–2.4)
Testosterone: 69 ng/dL (ref 10–70)

## 2015-08-11 LAB — VITAMIN D 25 HYDROXY (VIT D DEFICIENCY, FRACTURES): Vit D, 25-Hydroxy: 19 ng/mL — ABNORMAL LOW (ref 30–100)

## 2015-08-11 LAB — T3, FREE: T3, Free: 2.7 pg/mL (ref 2.3–4.2)

## 2015-08-11 LAB — T4, FREE: Free T4: 1.49 ng/dL (ref 0.80–1.80)

## 2015-08-11 LAB — ESTRADIOL: Estradiol: 71.9 pg/mL

## 2015-08-11 LAB — LUTEINIZING HORMONE: LH: 2.3 m[IU]/mL

## 2015-08-11 LAB — FOLLICLE STIMULATING HORMONE: FSH: 13.1 m[IU]/mL

## 2015-08-17 ENCOUNTER — Ambulatory Visit (INDEPENDENT_AMBULATORY_CARE_PROVIDER_SITE_OTHER): Payer: BLUE CROSS/BLUE SHIELD | Admitting: "Endocrinology

## 2015-08-17 ENCOUNTER — Encounter: Payer: Self-pay | Admitting: "Endocrinology

## 2015-08-17 VITALS — BP 110/72 | HR 76 | Wt 181.7 lb

## 2015-08-17 DIAGNOSIS — Q51811 Hypoplasia of uterus: Secondary | ICD-10-CM | POA: Diagnosis not present

## 2015-08-17 DIAGNOSIS — E669 Obesity, unspecified: Secondary | ICD-10-CM

## 2015-08-17 DIAGNOSIS — E063 Autoimmune thyroiditis: Secondary | ICD-10-CM

## 2015-08-17 DIAGNOSIS — I10 Essential (primary) hypertension: Secondary | ICD-10-CM

## 2015-08-17 DIAGNOSIS — E038 Other specified hypothyroidism: Secondary | ICD-10-CM | POA: Diagnosis not present

## 2015-08-17 DIAGNOSIS — E559 Vitamin D deficiency, unspecified: Secondary | ICD-10-CM

## 2015-08-17 DIAGNOSIS — N910 Primary amenorrhea: Secondary | ICD-10-CM | POA: Diagnosis not present

## 2015-08-17 DIAGNOSIS — E049 Nontoxic goiter, unspecified: Secondary | ICD-10-CM

## 2015-08-17 DIAGNOSIS — K219 Gastro-esophageal reflux disease without esophagitis: Secondary | ICD-10-CM

## 2015-08-17 LAB — POCT GLYCOSYLATED HEMOGLOBIN (HGB A1C): Hemoglobin A1C: 5.6

## 2015-08-17 LAB — GLUCOSE, POCT (MANUAL RESULT ENTRY): POC Glucose: 95 mg/dl (ref 70–99)

## 2015-08-17 NOTE — Progress Notes (Signed)
Chief complaint: Follow-up of primary amenorrhea, congenital hypoplastic uterus, acquired autoimmune hypothyroidism, Hashimoto's thyroiditis, goiter, kyphosis, scoliosis, osteopenia, s/p repair of double outlet right ventricle, and gastroesophageal reflux disease  History of present illness: The patient is a 24 year-old Caucasian young woman. She was accompanied by her father.  1. We have followed this young woman since 10/28/2010 when she was referred by Dr. Crawford Givens, [facility] for evaluation of hypothyroidism and primary amenorrhea.   A. At age 18 months the child was noted to be somewhat slow to walk. She was referred to a pediatric neurologist, Dr. Wyline Copas, for further evaluation. Dr. Gaynell Face noted some minor gross motor and fine motor delays, but also noted a significant cardiac murmur. 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 After several years of follow-up, the patient underwent corrective surgery in 2009. She has done well clinically since that time.  B. 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 The geneticist noted the presence of a scoliosis and some limb length discrepancy. She had flattening of the left occipital region as compared to the right. Her scalp hair was thin, especially sparse in the temporal regions. The forehead was flat. She had short palpebral fissures and a wide nasal bridge. Epicanthal folds were present. She had a thin upper lip and some underbite. She also had facial asymmetry with the right cheek being more prominent than the left. The lower lip was also more prominent on the right than on the left. The neck was short and wide. The nipples were felt to be widely spaced. Labia majora were  slightly hypoplastic. She had mild soft tissue syndactyly between the third and fourth digits bilaterally. The right lower extremity was longer than the left. The right foot was longer than the left foot. The right thigh was somewhat longer than the left thigh. The right leg was longer in length compared to the left. All of the right toes were larger than the left toes. Left nails were smaller and dystrophic. She also had a thoracolumbar scoliosis with convexity to the right. She walked with a tilt of her pelvis to the left side. 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 Because the clinical findings suggested the possibility of a genetic disorder, a karyotype was obtained. She had a 84, XX karyotype. A FISH study was also performed. This study showed no evidence for the common form of DiGeorge Syndrome. The geneticist was not able to make a specific genetic diagnosis. Although the geneticist requested that the family return for a follow-up examination, it appears that did not happen.  C. On 09/03/10 the patient was evaluated at [facility]. The reason for referral was primary amenorrhea at age 21. The patient's breasts were noted to be Tanner stage III. The vagina looked normal externally. The patient was unable to tolerate either a speculum exam or pelvic exam. Pregnancy test was negative. Laboratory data showed a TSH of 9.296, free T4 0.91, and T3 of 126.5. Her TSH was definitely elevated. The free T4 and T3 were in the lower portion of the normal range. FSH was very elevated at  92.5. Prolactin was 4.2. Repeat karyotype was 40, XX. A pelvic ultrasound was performed in the [facility] office.  The uterus and ovaries were not clearly seen. Endometrial stripe was not seen. It was commented that additional imaging may be necessary.  D. On 10/28/10 the patient was seen by our physician assistant, who obtained additional history to  include past issues with allergies, asthma, and gastroesophageal reflux disease. At that point the patient was taking Singulair daily, Advair daily, omeprazole daily, and cetirizine (Zyrtec) as needed. On examination the patient weighed 148 pounds which was at the 80th percentile. Her height was at the 8th percentile. Her blood pressure was 124/80 and heart rate was 68. She did have somewhat dysmorphic facies. A mildly enlarged, but diffusely enlarged, nontender goiter was noted. Laboratory data showed a TSH of 7.517, free T4 of 1.02, and free T3 of 3.4. FSH was 90 and LH was 35. Testosterone was 23.56. Estradiol was 17.2. She definitely had both primary hypogonadism and primary hypothyroidism. At that point she was started on Synthroid, 25 mcg per day.  2. Upon our physician assistant's departure at the end of March 2012, the patient was rescheduled to see me on 01/31/2011. She was taking her Synthroid 25, mcg per day at that time. Her goiter was approximately 20-25 grams in size. The thyroid was non-tender. She was still amenorrheic. Thyroid tests done on that day showed a TSH of 1.667, free T4  1.26, and free T3 3.5. These tests were mid-range normal. Her TPO antibody was elevated at 299, c/w Hashimoto's thyroiditis. She was trying to follow our  Eat Right Diet plan. She was also walking at least an hour per day. She had lost 16 pounds. At her visit on 08/30/11, I met with the patient and her father.The patient was then taking Synthroid, 37.5 mcg/day.  I learned that her mother was not attending the visits in our clinic on the building's third floor because the mother was very afraid of heights and elevators. I made arrangements to see the patient and parents in the first floor radiology conference room on 09/13/11. Dr. Janeal Holmes, MD, PhD, our staff geneticist, joined me on that visit and we provided education to the family about possible Turner's Syndrome mosaicism, hypothyroidism, and Hashimoto's  diease.   3. On 03/15/12 Dr. Abelina Bachelor and I contacted Dr. Freddy Jaksch, pediatric endocrinologist at [facility] and a nationally recognized expert on Turner's Syndrome. We presented Thursa's case to Dr. Melina Fiddler. Although Dr. Melina Fiddler agreed that Ander Purpura likely had a genetic basis for her complex congenital heart disease and other issues, Dr. Melina Fiddler did not feel that Carriann had Turner's mosaicism, but she could not identify any other recognized syndrome that would fit Jirah. When I asked Dr. Melina Fiddler her opinion as to whether or not we need to surgically define the gonads before beginning hormone replacement therapy, Dr. Melina Fiddler stated that we probably did not need to do so, but she did understand my concern that if an ovotestis or ovotestes were present, it/they should be removed due to the risk of possible later development of gonadal cancer. Dr. Melina Fiddler suggested that when we are ready to start hormone replacement therapy we use Vivelle-Dot transdermal estradiol, beginning with a dose of 25 mcg/day and gradually increasing the dose to 100 mcg/day over 1-2 years time. We should then wait for spontaneous occurrence of menses.  We could then add a progestin, such as Prometrium, on days 1-10 of each month.     4. In the succeeding three years Valaree's Hashimoto's disease T lymphocytes have gradually but progressively destroyed more thyrocytes. In response, we have gradually  increased the patient's Synthroid dosage to 88 mcg/day in order to keep her euthyroid. We have also been gradually increasing the strength of her  Vivelle-Dot patches.  5. The patient's last [facility] clinic visit was on 04/09/15. I increased her mini-Vivelle estradiol patch to the 0.100 strength every 3-4 days. She has not noticed any changes in breast size, breast tenderness, or vaginal secretions. She has not had any vaginal bleeding. I continued her Synthroid dose of 88 mcg/day. In the interim, she has been healthy. Her allergies  have not been acting up. She continues taking omeprazole, 20 mg, twice daily. She also takes a MVI per day, but had missed many doses of Citracal-D once per day. Dad says that she is back on the Citracal-D now. She is eating smaller portions and is exercising more.   6. Pertinent Review of Systems: Constitutional: The patient feels "good". She is healthy and has no significant complaints. Eyes: Vision is good. There are no significant eye complaints. Neck: The patient has no complaints of anterior neck swelling, soreness, tenderness,  pressure, discomfort, or difficulty swallowing.  Heart: The patient has no complaints of palpitations, irregular heat beats, chest pain, or chest pressure. She saw her [facility] pediatric cardiologist, Dr Ermalene Searing, in mid-October 2014. He gave hear a clean bill of health. Her next scheduled FU will be in 2019. Gastrointestinal: Bowel movents seem normal. The patient has no complaints of excessive hunger, acid reflux, upset stomach, stomach aches or pains, diarrhea, or constipation. Legs: Muscle mass and strength seem normal. There are no complaints of numbness, tingling, burning, or pain. No edema is noted. Feet: There are no obvious foot problems. There are no complaints of numbness, tingling, burning, or pain. No edema is noted. GYN: The patient has not had any significant increase in breast development or pubertal development. Hair: She says that she is still losing hair, but is not having any bare spots.    PAST MEDICAL, FAMILY, AND SOCIAL HISTORY:  1. School and Family: She is not in school anymore. She remains unemployed. She does not snack at night as much as she used to. Her mother is being treated with some rare form of chemotherapy for her worsening lung cancer. Both dad and paternal grandmother have thin hair. 2. Activities: She walks 60 minutes daily when the weather is good.   3. Tobacco, alcohol, and illicit drugs: None 4. PCP: Dr. Saddie Benders 5. GYN:  Dr. Gwynneth Munson Dillard 6. Allergy: Kaitlinn no longer needs the Qvar, Advair, or albuterol MDI. 7. Genetics: Dr. Janeal Holmes  REVIEW OF SYSTEMS: There are no other significant problems involving her other body systems.  PHYSICAL EXAM: BP 110/72 mmHg  Pulse 76  Wt 181 lb 11.2 oz (82.419 kg) She has lost 6 pounds since last visit.  Constitutional: The patient looks healthy and appears physically and emotionally well.  She is brighter today and is very pleased with her weight loss. She is again alert and pleasant, but still does not initiate much conversation. Her insight appears to be better than it was at our last visit.   Facies remain asymmetric/dysmorphic. The mouth is somewhat twisted rotationally, as before.  Eyes: There is no arcus or proptosis.  Mouth: The oropharynx appears normal. The tongue appears normal. There is normal oral moisture. There is no obvious gingivitis. Neck: There are no bruits present. The thyroid gland is low-lying.The thyroid gland is smaller at about 20+ grams in size. The isthmus is normal today. The right lobe is now  within normal limits for size. The left lobe is only slightly enlarged. The consistency of the thyroid gland is normal. There is no thyroid tenderness to palpation. Lungs: The lungs are clear. Air movement is good. Heart: The heart rhythm and rate appear normal. Heart sounds S1 and S2 are normal. I do not appreciate any pathologic heart murmurs.  Abdomen: The abdomen is still enlarged today. Bowel sounds are normal. The abdomen is soft and non-tender. There is no obviously palpable hepatomegaly, splenomegaly, or other masses.  Arms: Muscle mass appears appropriate for age.  Hands: There is no obvious tremor. Phalangeal and metacarpophalangeal joints appear normal. Palms are normal. Legs: Muscle mass appears appropriate for age. There is no edema.  Neurologic: Muscle strength is normal for age and gender  in both the upper and the lower extremities.  Muscle tone appears normal. Sensation to touch is normal in the legs. Hair: Her hair is thin at the vertex, but overall seems to be thicker over time, c/w estrogen effect. . There are no bare spots.  Breasts: The right breast in Tanner stage 1.8. The right areola is more prominent, but slightly smaller at 48 mm in longest dimension, compared with 50 mm at last visit. The left breast is Tanner stage 1.0.  The left areola measures 34 mm in longest dimension, compared with 37 mm at last visit.  I can feel a 2-3 mm right breast bud today, but no left breast bud.    Laboratory data:     08/17/15: HbA1c 5.6%  Labs 08/10/15: TSH 0.942, free T4 1.49, free T3 2.7; LH 2.3, FSH 13.1, testosterone 69, estradiol 71.0; PTH 44, calcium 9.3, 25-OH vitamin D 19  Labs 04/02/15: HbA1c was 6.0%. PTH 36, calcium 9.5, 25-OH vitamin D 20; LH 6.6, FSH 13, estradiol 84.8, testosterone 37; TSH 2.152, free T4 1.31, free T3 2.9  Labs 11/24/14: HbA1c was 5.7% today, compared with 6.1% at last visit and with 5.8% at the visit prior; calcium 9.2, PTH 49; 25-OH vitamin D 22; TSH 0.449, free T4 1.54, free T3 3.5; testosterone 41, estradiol 34.4  Labs 07/21/14: CMP normal; estradiol 30.2, FSH 64.1, LH 30.1, testosterone 41; TSH 3.596, free T4 1.75, free T3 3.3; PTH 42, calcium 9.4, 215-hydroxy vitamin D 33  Labs 12/24/13: TSH 0.768, free T4 1.42, free T3 3.0; estradiol 52.3, FSH 26.6, LH 10.3, testosterone 46; CMP normal   Labs 08/07/13: BMP normal, with calcium 9.5; estradiol 22.7; FSH 46.7, LH 18.3; TSH 1.787, free T4 1.63, free T3 3.1; testosterone 43   Labs 05/03/13: Estradiol 17.5, testosterone 66, free testosterone 14.9, LH 44.8, FSH 91.4, 25-hydroxy vitamin D 32, 1,25-dihydroxy vitamin D 55  Labs 01/18/13: BMP normal, to include calcium of 9.9; TSH 1.156, free T4 1.51, free T3 3.1; testosterone 46 (increased from 33.05), estradiol 16.2 (increased from < 11.8); LH 34.4 (decreased from 36.5), FSH 86.3 (decreased from 106.1);  PTH 36.8, phosphorus 4.0, 25-hydroxy vitamin D 27   Labs 10/12/12: TSH 2.079, free T4 1.44, free T3 2.9, LH 36.5,  FSH 106.2, estradiol <11.8, Testosterone 29.94, calcium 9.8, phosphorus 4.5, 25-vitamin D 29,   Labs 05/09/12: TSH 0.351, free T4 1.73, free T3 3.1   Labs 03/05/12: TSH 3.236, free T4 1.60, free T3 3.1   Labs 12/14/11: TSH 3.8, free T4 1.39, free T3 3.3, TPO 381, LH 33.4, FSH 86.9, testosterone 33.5, free testosterone 7.2 (normal 1.0-5.0)  Estradiol 13.6,  Labs 08/22/11: TSH 3.183, free T4 1.40, free T3 3.4. testosterone 34.45, estradiol 14.8     Buccal smear - normal  IMAGING: BMD 05/29/13: BMD in spine was > 50%. BMD in the hip was above the 95%. Z-scores were 0.3 and 1.68 respectively.   ASSESSMENT: 1. Hypothyroidism: The patient is euthyroid again this month on her Synthroid dose of 88 mcg/day. The fluctuations of her TFTs since last visit suggest that she had some intermittent thyroiditis during that time.  2. Hashimoto's thyroiditis: Her Hashimoto's disease is clinically quiescent, but intermittently active. The pattern of all three TFTS shifting in parallel together in the same direction, upward or downward, that occurred between October 2014 and March 2015, is pathognomonic for having had an interim flare up of Hashimoto's thyroiditis.  3. Goiter: The thyroid gland is much smaller today. The waxing and waning of thyroid gland size is also c/w evolving Hashimoto's disease.  4. Primary Amenorrhea/Congenital hypoplastic uterus:   A. When she became euthyroid, she had increases in [facility] and [place] into the menopausal range. Her LH and FSH have decreased on estradiol therapy and her testosterone and estradiol have increased. Some of these increases may have been due to increased ovarian production of these hormones. Some of the increased testosterone may have been caused by the effects of overly fat adipose cell cytokines causing insulin resistance and hyperinsulinemia, the  hyperinsulinemia in turn causing elevated testosterone. Some of the increased estradiol may have been due to increased aromatization of testosterone by adipose cells.   B. When we began estrogen replacement therapy, our immediate estrogen replacement goals were three-fold:   1. To promote estrogenization of breasts and vaginal tissues   2. To promote feminization of her brain and body overall   3. To improve bone mineral density   C. Jamilet has responded fairly well to [place]. Her left areola has increased in size since last visit. Her LH and FSH have decreased significantly, but her estradiol has also decreased a bit. Her testosterone has increased a bit. This may be a delayed effect from her improvement in weight and HbA1c. .   D. We will continue her current dose of mini-Vivelle again today.  We will reassess her clinical exam and lab tests every 3-4 months.  5. Obesity: Patient's weight has decreased. She has been exercising more and eating in a more healthy manner. She needs to continue to burn off more calories than she takes in.  6. Developmental delays: As noted in my November 2012 note, the patient had an IQ test which gave her an IQ of 75. In today's visit and in the past two visits  her interactions are c/w that IQ or perhaps a somewhat lower IQ.    7. GERD: Her GERD and dyspepsia are well-controlled with omeprazole. At this point she shows no signs of tachyphylaxis.  8. Kyphosis: Her repeat scoliosis series did not show progression. In fact, there was a suggestion of improvement. 9. Hypertension: Her systolic BP and diastolic BPs are fine now. Continuing exercise will help.  10. Pre-diabetes: Her HbA1c is now back within normal limits after losing fat weight. Continuing to eat right,  exercise, and lose fat weight will help.  11. Vitamin D deficiency disease: She needs more vitamin D and more calcium.  PLAN: 1. Diagnostic: Repeat the testosterone, estradiol, LH, and FSH, plus  25-hydroxy vitamin D, calcium, and PTH in 4 months. Schedule US of the pelvis.  2. Therapeutic: Continue the mini-Vivelle to 0.100 mg patch  every 3-4 days.  Continue Synthroid at 88 mcg per day per day. Continue omeprazole, 20 mg, twice daily. Take one Citracal-D, twice daily, at lunch and dinner.  Add Biotek one 50,00 IU capsule once a week. 3. Patient education: We discussed the fact that we started ERT in order to promote feminization and to achieve as much bone mineralization as possible. The uterus may or may not grow. We discussed the possible need for progestin therapy. We also discussed the possible need for hysterectomy and/or gonadectomy to remove any possible testicular tissue. 4. Follow-up: Follow-up visit in 4 months.  Level of Service: This visit lasted in excess of 45 minutes. More than 50% of the visit was devoted to counseling.  Sherrlyn Hock

## 2015-08-17 NOTE — Patient Instructions (Signed)
Follow up visit in 4 months. Please repeat lab tests one week prior.  

## 2015-08-20 ENCOUNTER — Other Ambulatory Visit: Payer: BLUE CROSS/BLUE SHIELD

## 2015-08-27 ENCOUNTER — Ambulatory Visit
Admission: RE | Admit: 2015-08-27 | Discharge: 2015-08-27 | Disposition: A | Discharge status: Home / Self Care | Payer: BLUE CROSS/BLUE SHIELD | Source: Ambulatory Visit | Attending: "Endocrinology | Admitting: "Endocrinology

## 2015-08-27 DIAGNOSIS — Q51811 Hypoplasia of uterus: Secondary | ICD-10-CM

## 2015-08-27 DIAGNOSIS — N910 Primary amenorrhea: Secondary | ICD-10-CM

## 2015-09-06 ENCOUNTER — Other Ambulatory Visit: Payer: Self-pay | Admitting: "Endocrinology

## 2015-09-14 ENCOUNTER — Telehealth: Payer: Self-pay | Admitting: "Endocrinology

## 2015-09-14 ENCOUNTER — Other Ambulatory Visit: Payer: Self-pay | Admitting: *Deleted

## 2015-09-14 DIAGNOSIS — N912 Amenorrhea, unspecified: Secondary | ICD-10-CM

## 2015-09-17 NOTE — Telephone Encounter (Signed)
While trying to contact the family and look up lab results on the patient, the contact with the telephone call was lost.

## 2015-09-27 ENCOUNTER — Telehealth: Payer: Self-pay | Admitting: "Endocrinology

## 2015-09-27 NOTE — Telephone Encounter (Signed)
1. I called Carolyn [name] and reached her mother.  2. Objective: I discussed the fact that the recent US showed a uterus and normal left ovary, but the right ovary was not seen. In the area of what should be the right ovary was a mass effect that the radiologist noted might represent bowel or an adrenal mass.  3. Assessment: Given the fact that Carolyn [name]'s testosterone has recently increased from the 40s to the 60s and the possibility that there may be some mass effect in the area of what should be the right ovary, the possibility of a right ovotestis exists. If so, there is a very high chance of the testicular part of the ovotestis undergoing malignant degeneration over time. I have ordered an MRI of the pelvis [facility] to determine if there is something in the right adnexal region that might require surgical removal. Given her own extensive experience with the evaluation and treatment of her own cancer, mother understands that the MRI is needed.  4. Plan: Mom and [facility] and schedule the MRI. Once we know the results, we can then determine the best course of action.  David StallBRENNAN,Jeury Mcnab J

## 2015-09-28 ENCOUNTER — Telehealth: Payer: Self-pay | Admitting: "Endocrinology

## 2015-09-28 NOTE — Telephone Encounter (Signed)
Will advised MD

## 2015-10-01 ENCOUNTER — Encounter: Payer: Self-pay | Admitting: Allergy and Immunology

## 2015-10-01 ENCOUNTER — Ambulatory Visit (INDEPENDENT_AMBULATORY_CARE_PROVIDER_SITE_OTHER): Payer: BLUE CROSS/BLUE SHIELD | Admitting: Allergy and Immunology

## 2015-10-01 VITALS — BP 138/78 | HR 82 | Temp 97.8°F | Resp 20

## 2015-10-01 DIAGNOSIS — J310 Chronic rhinitis: Secondary | ICD-10-CM

## 2015-10-01 DIAGNOSIS — J4530 Mild persistent asthma, uncomplicated: Secondary | ICD-10-CM | POA: Diagnosis not present

## 2015-10-01 MED ORDER — CETIRIZINE HCL 10 MG PO TABS: 10.0000 mg | ORAL_TABLET | Freq: Every day | ORAL | Status: DC

## 2015-10-01 MED ORDER — MONTELUKAST SODIUM 10 MG PO TABS: 10.0000 mg | ORAL_TABLET | Freq: Every day | ORAL | Status: DC

## 2015-10-01 NOTE — Patient Instructions (Addendum)
   Continue current medication regime.  May give trial of Zyrtec as needed.  Follow-up in July 2017 or sooner if needed.

## 2015-10-01 NOTE — Progress Notes (Signed)
FOLLOW UP NOTE  RE: Carolyn [name] MRN: [id number] DOB: 09/23/1991 [facility] [address] [place] [id number] Date of Office Visit: 10/01/2015  Subjective:  Carolyn [name] is a 24 y.o. female who presents today regarding respiratory symptoms/medication refills.  Assessment:  No diagnosis found. Plan:   Meds ordered this encounter  Medications  . montelukast (SINGULAIR) 10 MG tablet    Sig: Take 1 tablet (10 mg total) by mouth at bedtime.    Dispense:  30 tablet    Refill:  3  . cetirizine (ZYRTEC) 10 MG tablet    Sig: Take 1 tablet (10 mg total) by mouth daily.    Dispense:  30 tablet    Refill:  5   Patient Instructions  1.  Continue current medication regime. 2.  Give trial of Zyrtec as needed. 3.  Follow-up in July 2017 or sooner if needed, consider trial off Singulair if continues to do very well.   HPI: Carolyn ShamesLauren returns to the office in follow-up of chronic rhinitis (negative aeroallergen skin prick/lab testing years ago) and asthma.  She has done well since her last office visit in May.  No recent albuterol use or new breathing concerns.  She typically is not very active/no exercise classes or gym participation but denies any activity-induced complaints and feels her breathing is "very good".  No recent upper respiratory infections, chronic sinus difficulties or new medical issues.  No hospitalizations, new diagnoses or new concerns.  Skin is doing well. She follows with cardiologist every 5 years.  Denies ED or urgent care visits, prednisone or antibiotic courses. Reports sleep and activity are normal.  Current Medications: 1.  Singulair 10 mg daily. 2.  Zyrtec 10 mg daily. 3.  Albuterol HFA as needed. 4.  Continues Vivelle-DOT, Synthroid, and omeprazole.  Drug Allergies: Allergies  Allergen Reactions  . Codeine   . Morphine And Related   . Omnicef [Cefdinir]   . Penicillins    Objective:   Filed Vitals:   10/01/15 1536   BP: 138/78  Pulse: 82  Temp: 97.8 F (36.6 C)  Resp: 20   Physical Exam  Constitutional: She is well-developed, well-nourished, and in no distress.  HENT:  Head: Atraumatic.  Right Ear: Tympanic membrane and ear canal normal.  Left Ear: Tympanic membrane and ear canal normal.  Nose: Mucosal edema present. No rhinorrhea. No epistaxis.  Mouth/Throat: Oropharynx is clear and moist and mucous membranes are normal. No oropharyngeal exudate, posterior oropharyngeal edema or posterior oropharyngeal erythema.  Neck: Neck supple.  Cardiovascular: Normal rate, S1 normal and S2 normal.   No murmur heard. Pulmonary/Chest: Effort normal. She has no wheezes. She has no rhonchi. She has no rales.  Lymphadenopathy:    She has no cervical adenopathy.   Diagnostics: Spirometry FVC  2.46--68, FEV1 2.24--76%.    Carolyn M. Willa RoughHicks, MD  cc: Smitty CordsGOSRANI,SHILPA R, MD

## 2015-10-05 ENCOUNTER — Ambulatory Visit
Admission: RE | Admit: 2015-10-05 | Discharge: 2015-10-05 | Disposition: A | Discharge status: Home / Self Care | Payer: BLUE CROSS/BLUE SHIELD | Source: Ambulatory Visit | Attending: "Endocrinology | Admitting: "Endocrinology

## 2015-10-05 DIAGNOSIS — N912 Amenorrhea, unspecified: Secondary | ICD-10-CM

## 2015-10-05 MED ORDER — GADOBENATE DIMEGLUMINE 529 MG/ML IV SOLN
17.0000 mL | Freq: Once | INTRAVENOUS | Status: AC | PRN
  Administered 2015-10-05: 17 mL via INTRAVENOUS

## 2015-11-09 ENCOUNTER — Other Ambulatory Visit: Payer: Self-pay | Admitting: Allergy and Immunology

## 2015-11-22 ENCOUNTER — Other Ambulatory Visit: Payer: Self-pay | Admitting: Allergy and Immunology

## 2015-12-16 ENCOUNTER — Ambulatory Visit: Payer: BLUE CROSS/BLUE SHIELD | Admitting: "Endocrinology

## 2015-12-16 ENCOUNTER — Telehealth: Payer: Self-pay | Admitting: "Endocrinology

## 2015-12-16 NOTE — Telephone Encounter (Signed)
1. I called the family to discuss the results of her pelvic MRI. 2. Objective: The ovaries were normal in size. No masses were seen. The uterus may have a septum that extends toward the cervix. This is not a problem unless Carolyn [name] were to become pregnant, which won't occur because she is anovulatory.  3. Assessment: There was no apparent ovotestis seen. David Stall

## 2015-12-29 LAB — COMPREHENSIVE METABOLIC PANEL
ALT: 13 U/L (ref 6–29)
AST: 12 U/L (ref 10–30)
Albumin: 4 g/dL (ref 3.6–5.1)
Alkaline Phosphatase: 72 U/L (ref 33–115)
BUN: 10 mg/dL (ref 7–25)
CO2: 24 mmol/L (ref 20–31)
Calcium: 9.2 mg/dL (ref 8.6–10.2)
Chloride: 103 mmol/L (ref 98–110)
Creat: 1.1 mg/dL (ref 0.50–1.10)
Glucose, Bld: 81 mg/dL (ref 70–99)
Potassium: 4.4 mmol/L (ref 3.5–5.3)
Sodium: 138 mmol/L (ref 135–146)
Total Bilirubin: 0.4 mg/dL (ref 0.2–1.2)
Total Protein: 6.2 g/dL (ref 6.1–8.1)

## 2015-12-29 LAB — TESTOSTERONE, FREE AND TOTAL (INCLUDES SHBG)-(MALES)
Sex Hormone Binding: 28 nmol/L (ref 17–124)
Testosterone, Free: 8.5 pg/mL — ABNORMAL HIGH (ref 0.6–6.8)
Testosterone-% Free: 2 % (ref 0.4–2.4)
Testosterone: 43 ng/dL

## 2015-12-29 LAB — T4, FREE: Free T4: 1.7 ng/dL (ref 0.8–1.8)

## 2015-12-29 LAB — LUTEINIZING HORMONE: LH: 2 m[IU]/mL

## 2015-12-29 LAB — TSH: TSH: 0.83 mIU/L

## 2015-12-29 LAB — PTH, INTACT AND CALCIUM
Calcium: 9.2 mg/dL (ref 8.4–10.5)
PTH: 54 pg/mL (ref 14–64)

## 2015-12-29 LAB — FOLLICLE STIMULATING HORMONE: FSH: 6.3 m[IU]/mL

## 2015-12-29 LAB — ESTRADIOL: Estradiol: 119 pg/mL

## 2015-12-29 LAB — T3, FREE: T3, Free: 3 pg/mL (ref 2.3–4.2)

## 2015-12-29 LAB — VITAMIN D 25 HYDROXY (VIT D DEFICIENCY, FRACTURES): Vit D, 25-Hydroxy: 21 ng/mL — ABNORMAL LOW (ref 30–100)

## 2016-01-04 ENCOUNTER — Encounter: Payer: Self-pay | Admitting: "Endocrinology

## 2016-01-04 ENCOUNTER — Ambulatory Visit (INDEPENDENT_AMBULATORY_CARE_PROVIDER_SITE_OTHER): Payer: BLUE CROSS/BLUE SHIELD | Admitting: "Endocrinology

## 2016-01-04 VITALS — BP 122/78 | HR 70 | Wt 177.8 lb

## 2016-01-04 DIAGNOSIS — E063 Autoimmune thyroiditis: Secondary | ICD-10-CM

## 2016-01-04 DIAGNOSIS — N910 Primary amenorrhea: Secondary | ICD-10-CM

## 2016-01-04 DIAGNOSIS — K219 Gastro-esophageal reflux disease without esophagitis: Secondary | ICD-10-CM

## 2016-01-04 DIAGNOSIS — E038 Other specified hypothyroidism: Secondary | ICD-10-CM | POA: Diagnosis not present

## 2016-01-04 DIAGNOSIS — E049 Nontoxic goiter, unspecified: Secondary | ICD-10-CM | POA: Diagnosis not present

## 2016-01-04 DIAGNOSIS — E669 Obesity, unspecified: Secondary | ICD-10-CM | POA: Diagnosis not present

## 2016-01-04 DIAGNOSIS — E559 Vitamin D deficiency, unspecified: Secondary | ICD-10-CM

## 2016-01-04 DIAGNOSIS — I10 Essential (primary) hypertension: Secondary | ICD-10-CM

## 2016-01-04 LAB — POCT GLYCOSYLATED HEMOGLOBIN (HGB A1C): Hemoglobin A1C: 5.5

## 2016-01-04 LAB — GLUCOSE, POCT (MANUAL RESULT ENTRY): POC Glucose: 96 mg/dl (ref 70–99)

## 2016-01-04 NOTE — Progress Notes (Signed)
Chief complaint: Follow-up of primary amenorrhea, congenital hypoplastic uterus, acquired autoimmune hypothyroidism, Hashimoto's thyroiditis, goiter, kyphosis, scoliosis, osteopenia, s/p repair of double outlet right ventricle, and gastroesophageal reflux disease  History of present illness: The patient is a 25 year-old Caucasian young woman. She was accompanied by her father.  1. We have followed this young woman since 10/28/2010 when she was referred by Dr. Crawford Givens, [facility], for evaluation of hypothyroidism and primary amenorrhea.   A. At age 13 months the child was noted to be somewhat slow to walk. She was referred to a pediatric neurologist, Dr. Wyline Copas, for further evaluation. Dr. Gaynell Face noted some minor gross motor and fine motor delays, but also noted a significant cardiac murmur. 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 After several years of follow-up, the patient underwent corrective surgery in 2009. She has done well clinically since that time.  B. 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 The geneticist noted the presence of a scoliosis and some limb length discrepancy. She had flattening of the left occipital region as compared to the right. Her scalp hair was thin, especially sparse in the temporal regions. The forehead was flat. She had short palpebral fissures and a wide nasal bridge. Epicanthal folds were present. She had a thin upper lip and some underbite. She also had facial asymmetry with the right cheek being more prominent than the left. The lower lip was also more prominent on the right than on the left. The neck was short and wide. The nipples were felt to be widely spaced. Labia majora were  slightly hypoplastic. She had mild soft tissue syndactyly between the third and fourth digits bilaterally. The right lower extremity was longer than the left. The right foot was longer than the left foot. The right thigh was somewhat longer than the left thigh. The right leg was longer in length compared to the left. All of the right toes were larger than the left toes. Left nails were smaller and dystrophic. She also had a thoracolumbar scoliosis with convexity to the right. She walked with a tilt of her pelvis to the left side. 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 Because the clinical findings suggested the possibility of a genetic disorder, a karyotype was obtained. She had a 56, XX karyotype. A FISH study was also performed. This study showed no evidence for the common form of DiGeorge Syndrome. The geneticist was not able to make a specific genetic diagnosis. Although the geneticist requested that the family return for a follow-up examination, it appears that did not happen.  C. On 09/03/10 the patient was evaluated at [facility]. The reason for referral was primary amenorrhea at age 89. The patient's breasts were noted to be Tanner stage III. The vagina looked normal externally. The patient was unable to tolerate either a speculum exam or pelvic exam. Pregnancy test was negative. Laboratory data showed a TSH of 9.296, free T4 0.91, and T3 of 126.5. Her TSH was definitely elevated. The free T4 and T3 were in the lower portion of the normal range. FSH was very elevated at  92.5. Prolactin was 4.2. Repeat karyotype was 5, XX. A pelvic ultrasound was performed in the [facility] office.  The uterus and ovaries were not clearly seen. Endometrial stripe was not seen. It was commented that additional imaging may be necessary.  D. On 10/28/10 the patient was seen by our physician assistant, who obtained additional history to  include past issues with allergies, asthma, and gastroesophageal reflux disease. At that point the patient was taking Singulair daily, Advair daily, omeprazole daily, and cetirizine (Zyrtec) as needed. On examination the patient weighed 148 pounds which was at the 80th percentile. Her height was at the 8th percentile. Her blood pressure was 124/80 and heart rate was 68. She did have somewhat dysmorphic facies. A mildly enlarged, but diffusely enlarged, nontender goiter was noted. Laboratory data showed a TSH of 7.517, free T4 of 1.02, and free T3 of 3.4. FSH was 90 and LH was 35. Testosterone was 23.56. Estradiol was 17.2. She definitely had both primary hypogonadism and primary hypothyroidism. At that point she was started on Synthroid, 25 mcg per day.  2. Upon our physician assistant's departure at the end of March 2012, the patient was rescheduled to see me on 01/31/2011. She was taking her Synthroid 25, mcg per day at that time. Her goiter was approximately 20-25 grams in size. The thyroid was non-tender. She was still amenorrheic. Thyroid tests done on that day showed a TSH of 1.667, free T4  1.26, and free T3 3.5. These tests were mid-range normal. Her TPO antibody was elevated at 299, c/w Hashimoto's thyroiditis. She was trying to follow our  Eat Right Diet plan. She was also walking at least an hour per day. She had lost 16 pounds. At her visit on 08/30/11, I met with the patient and her father.The patient was then taking Synthroid, 37.5 mcg/day.  I learned that her mother was not attending the visits in our clinic on the building's third floor because the mother was very afraid of heights and elevators. I made arrangements to see the patient and parents in the first floor radiology conference room on 09/13/11. Dr. Janeal Holmes, MD, PhD, our staff geneticist, joined me on that visit and we provided education to the family about possible Turner's Syndrome mosaicism, hypothyroidism, and Hashimoto's  diease.   3. On 03/15/12 Dr. Abelina Bachelor and I contacted Dr. Freddy Jaksch, pediatric endocrinologist at [facility] and a nationally recognized expert on Turner's Syndrome. We presented Esme's case to Dr. Melina Fiddler. Although Dr. Melina Fiddler agreed that Ander Purpura likely had a genetic basis for her complex congenital heart disease and other issues, Dr. Melina Fiddler did not feel that Gabriella had Turner's mosaicism, but she could not identify any other recognized syndrome that would fit Shanique. When I asked Dr. Melina Fiddler her opinion as to whether or not we need to surgically define the gonads before beginning hormone replacement therapy, Dr. Melina Fiddler stated that we probably did not need to do so, but she did understand my concern that if an ovotestis or ovotestes were present, it/they should be removed due to the risk of possible later development of gonadal cancer. Dr. Melina Fiddler suggested that when we are ready to start hormone replacement therapy we use Vivelle-Dot transdermal estradiol, beginning with a dose of 25 mcg/day and gradually increasing the dose to 100 mcg/day over 1-2 years time. We should then wait for spontaneous occurrence of menses.  We could then add a progestin, such as Prometrium, on days 1-10 of each month.     4. In the succeeding four years Verneal's Hashimoto's disease T lymphocytes have gradually but progressively destroyed more thyrocytes. In response, we have gradually  increased the patient's Synthroid dosage to 88 mcg/day in order to keep her euthyroid. We have also been gradually increasing the strength of her Vivelle-Dot patches.  5. The patient's last [facility] clinic visit was on 08/17/15. In the interim she has been healthy.   A. She has not noticed any changes in breast size or breast tenderness. She started having vaginal bleeding between November and January. Bleeding occurs about once every 6-8 weeks, and may last for up to 24 hours.   B. She remains on the mini-Vivelle 0.100 patch every  3-4 days, Synthroid 88 mcg/day, omeprazole, 20 mg, twice daily, and Citracal D twice daily (When Sanya said that she is taking the Citracal D twice daily, dad rolled his eyes in an exaggerated way.)  C. She is eating smaller portions and is exercising more.   6. Pertinent Review of Systems: Constitutional: The patient feels "good". She is healthy and has no significant complaints. Eyes: Vision is good. There are no significant eye complaints. Neck: The patient has no complaints of anterior neck swelling, soreness, tenderness,  pressure, discomfort, or difficulty swallowing.  Heart: The patient has no complaints of palpitations, irregular heat beats, chest pain, or chest pressure. She saw her [facility] pediatric cardiologist, Dr Ermalene Searing, in mid-October 2014. He gave hear a clean bill of health. Her next scheduled FU will be in 2019. Gastrointestinal: Bowel movents seem normal. The patient has no complaints of excessive hunger, acid reflux, upset stomach, stomach aches or pains, diarrhea, or constipation. Legs: Muscle mass and strength seem normal. There are no complaints of numbness, tingling, burning, or pain. No edema is noted. Feet: There are no obvious foot problems. There are no complaints of numbness, tingling, burning, or pain. No edema is noted. Hair: She says that she is still losing hair, but is not having any bare spots.    PAST MEDICAL, FAMILY, AND SOCIAL HISTORY:  1. School and Family: She is not in school anymore. She remains unemployed. She does not snack at night as much as she used to. Her mother is being treated with some rare form of chemotherapy for her worsening lung cancer. Both dad and paternal grandmother have thin hair. 2. Activities: She walks 60 minutes daily when the weather is good.   3. Tobacco, alcohol, and illicit drugs: None 4. PCP: Dr. Saddie Benders 5. GYN: Dr. Gwynneth Munson Dillard 6. Allergy: [facility] no longer needs the Qvar, Advair, or albuterol MDI. 7. Genetics: Dr.  Janeal Holmes  REVIEW OF SYSTEMS: There are no other significant problems involving her other body systems.  PHYSICAL EXAM: BP 122/78 mmHg  Pulse 70  Wt 177 lb 12.8 oz (80.65 kg) She has lost 4 pounds since last visit.  Constitutional: The patient looks healthy and appears physically and emotionally well.  She is brighter today and is very pleased with her progress. She is again alert and pleasant, but still does not initiate much conversation. Her insight appears to be better than it was at our last visit.   Facies remain asymmetric/dysmorphic. The mouth is somewhat twisted rotationally, as before.  Eyes: There is no arcus or proptosis.  Mouth: The oropharynx appears normal. The tongue appears normal. There is normal oral moisture. There is no obvious gingivitis. Neck: There are no bruits present. The thyroid gland is low-lying.The thyroid gland is larger at about 22 grams in size. Both lobes are symmetrically enlarged today. The consistency of the thyroid gland is normal. There is no thyroid tenderness to palpation. Lungs: The lungs  are clear. Air movement is good. Heart: The heart rhythm and rate appear normal. Heart sounds S1 and S2 are normal. I do not appreciate any pathologic heart murmurs.  Abdomen: The abdomen is still enlarged today. Bowel sounds are normal. The abdomen is soft and non-tender. There is no obviously palpable hepatomegaly, splenomegaly, or other masses.  Arms: Muscle mass appears appropriate for age.  Hands: There is no obvious tremor. Phalangeal and metacarpophalangeal joints appear normal. Palms are normal. Legs: Muscle mass appears appropriate for age. There is no edema.  Neurologic: Muscle strength is normal for age and gender  in both the upper and the lower extremities. Muscle tone appears normal. Sensation to touch is normal in the legs. Hair: Her hair at the vertex is thicker over time, c/w estrogen effect. There are no bare spots.   Laboratory data:      Labs 01/04/16: HbA1c 5.5%  Labs 12/28/15: TSH 0.83, free T4 1.7, free T3 3.0; PTH 54, calcium 9.2, 25-OH vitamin D 21; CMP normal; LH 2.0, FSH 6.3,estradiol 119,  testosterone  43, free testosterone 8.5  08/17/15: HbA1c 5.6%  Labs 08/10/15: TSH 0.942, free T4 1.49, free T3 2.7; LH 2.3, FSH 13.1, testosterone 69, estradiol 71.0; PTH 44, calcium 9.3, 25-OH vitamin D 19  Labs 04/02/15: HbA1c was 6.0%. PTH 36, calcium 9.5, 25-OH vitamin D 20; LH 6.6, FSH 13, estradiol 84.8, testosterone 37; TSH 2.152, free T4 1.31, free T3 2.9  Labs 11/24/14: HbA1c was 5.7% today, compared with 6.1% at last visit and with 5.8% at the visit prior; calcium 9.2, PTH 49; 25-OH vitamin D 22; TSH 0.449, free T4 1.54, free T3 3.5; testosterone 41, estradiol 34.4  Labs 07/21/14: CMP normal; estradiol 30.2, FSH 64.1, LH 30.1, testosterone 41; TSH 3.596, free T4 1.75, free T3 3.3; PTH 42, calcium 9.4, 215-hydroxy vitamin D 33  Labs 12/24/13: TSH 0.768, free T4 1.42, free T3 3.0; estradiol 52.3, FSH 26.6, LH 10.3, testosterone 46; CMP normal   Labs 08/07/13: BMP normal, with calcium 9.5; estradiol 22.7; FSH 46.7, LH 18.3; TSH 1.787, free T4 1.63, free T3 3.1; testosterone 43   Labs 05/03/13: Estradiol 17.5, testosterone 66, free testosterone 14.9, LH 44.8, FSH 91.4, 25-hydroxy vitamin D 32, 1,25-dihydroxy vitamin D 55  Labs 01/18/13: BMP normal, to include calcium of 9.9; TSH 1.156, free T4 1.51, free T3 3.1; testosterone 46 (increased from 33.05), estradiol 16.2 (increased from < 11.8); LH 34.4 (decreased from 36.5), FSH 86.3 (decreased from 106.1); PTH 36.8, phosphorus 4.0, 25-hydroxy vitamin D 27   Labs 10/12/12: TSH 2.079, free T4 1.44, free T3 2.9, LH 36.5,  FSH 106.2, estradiol <11.8, Testosterone 29.94, calcium 9.8, phosphorus 4.5, 25-vitamin D 29,   Labs 05/09/12: TSH 0.351, free T4 1.73, free T3 3.1   Labs 03/05/12: TSH 3.236, free T4 1.60, free T3 3.1   Labs 12/14/11: TSH 3.8, free T4 1.39, free T3 3.3, TPO 381, LH  33.4, FSH 86.9, testosterone 33.5, free testosterone 7.2 (normal 1.0-5.0)  Estradiol 13.6,                Labs 08/22/11: TSH 3.183, free T4 1.40, free T3 3.4. testosterone 34.45, estradiol 14.8     Buccal smear - normal  IMAGING:   MRI of pelvis 10/05/15: Uterus has divergent horns, but normal outer fundal contour. Associated fundal thickening is c/w septate uterus. Right ovary measures 11 x 8 mm and has a small simple cyst/follicle. Left ovary measures 11 x 8 mm aid is poorly evaluated but  grossly unremarkable.   US pelvis 08/27/15: Uterus measured 9.1 x 3.2 x 4.8 cm. Endometrial thickness was 14 mm. Right ovary was not well visualized. Left ovary measured 2.2 x 2.0 x 2.1 cm. The left ovary appeared normal. There was a non-specific hypoechoic area within the right adnexal region that might be secondary to overlying bowel structures.  BMD 05/29/13: BMD in spine was > 50%. BMD in the hip was above the 95%. Z-scores were 0.3 and 1.68 respectively.   ASSESSMENT: 1. Hypothyroidism: The patient is euthyroid again this month on her Synthroid dose of 88 mcg/day.   2. Hashimoto's thyroiditis: Her Hashimoto's disease is clinically quiescent, but intermittently active. The pattern of all three TFTS shifting in parallel together in the same direction, upward or downward, that occurred between October 2014 and March 2015, is pathognomonic for having had an interim flare up of Hashimoto's thyroiditis.  3. Goiter: The thyroid gland is larger today. The waxing and waning of thyroid gland size is also c/w evolving Hashimoto's disease.  4. Primary Amenorrhea/Congenital hypoplastic uterus:   A. When she became euthyroid, she had increases in [facility] and [place] into the menopausal range. Since initiating estradiol therapy, however, her LH and FSH have decreased and her testosterone and estradiol have increased. Some of these increases may have been due to increased ovarian production of these hormones. Some of the increased  testosterone may have been caused by the effects of overly fat adipose cell cytokines causing insulin resistance and hyperinsulinemia, the hyperinsulinemia in turn causing elevated testosterone. Some of the increased estradiol may have been due to increased aromatization of testosterone by adipose cells.   B. When we began estrogen replacement therapy, our immediate estrogen replacement goals were three-fold:   1. To promote estrogenization of breasts and vaginal tissues   2. To promote feminization of her brain and body overall   3. To improve bone mineral density   C. Ramonita has responded fairly well to [place]. She is now having vaginal bleeding. We will continue her current dose of mini-Vivelle again today.  We will reassess her clinical exam and lab tests every 3-4 months. It is appropriate to refer her back to Dr. Charlesetta Garibaldi to see if Dr. Charlesetta Garibaldi wants to add a Progestin.  5. Obesity: Patient's weight has decreased. She has been exercising more and eating in a more healthy manner. She needs to continue to burn off more calories than she takes in.  6. Developmental delays: As noted in my November 2012 note, the patient had an IQ test which gave her an IQ of 44. In today's visit and in the past two visits  her interactions are c/w that IQ or perhaps a somewhat lower IQ.    7. GERD: Her GERD and dyspepsia are well-controlled with omeprazole. At this point she shows no signs of tachyphylaxis.  8. Kyphosis: Her repeat scoliosis series did not show progression. In fact, there was a suggestion of improvement. 9. Hypertension: Her systolic BP and diastolic BPs are higher today. She needs to continue daily exercise and loss of fat weight.  10. Pre-diabetes: Her HbA1c is now back within normal limits after losing fat weight. Continuing to eat right,  exercise, and lose fat weight will help.  11. Vitamin D deficiency disease: She needs more vitamin D and more calcium. I again recommended Biotech, 50,000 IU  per week.  PLAN: 1. Diagnostic: Repeat the testosterone, estradiol, LH, and FSH, plus 25-hydroxy vitamin D, calcium, and PTH in 4 months.  2.  Therapeutic: Continue the mini-Vivelle at 0.100 mg patch every 3-4 days.  Continue Synthroid at 88 mcg per day per day. Continue omeprazole, 20 mg, twice daily. Take one Citracal-D, twice daily, at lunch and dinner.  Add Biotech one 50,00 IU capsule once a week. 3. Patient education: We discussed the fact that we started ERT in order to promote feminization and to achieve as much bone mineralization as possible. The uterus may or may not grow. We discussed the possible need for progestin therapy. We also discussed the possible need for hysterectomy and/or gonadectomy to remove any possible ovotesticular tissue. Her recent MRI, however, shows no evidence for abnormal ovaries, so surgery will probably not be needed. 4. Follow-up: Follow-up visit in 4 months.  Level of Service: This visit lasted in excess of 45 minutes. More than 50% of the visit was devoted to counseling.  Sherrlyn Hock

## 2016-01-04 NOTE — Patient Instructions (Signed)
Follow up visit in 4 months. Please repeat blood tests 2 weeks prior to next appointment. Please order Biotech, weekly form of Vitamin D.

## 2016-02-09 ENCOUNTER — Other Ambulatory Visit: Payer: Self-pay | Admitting: Allergy and Immunology

## 2016-03-02 ENCOUNTER — Other Ambulatory Visit: Payer: Self-pay | Admitting: *Deleted

## 2016-03-02 DIAGNOSIS — R1013 Epigastric pain: Secondary | ICD-10-CM

## 2016-03-02 MED ORDER — OMEPRAZOLE 20 MG PO CPDR: 20.0000 mg | DELAYED_RELEASE_CAPSULE | Freq: Two times a day (BID) | ORAL | Status: DC

## 2016-03-07 ENCOUNTER — Other Ambulatory Visit: Payer: Self-pay | Admitting: "Endocrinology

## 2016-03-31 ENCOUNTER — Encounter: Payer: Self-pay | Admitting: Allergy and Immunology

## 2016-03-31 ENCOUNTER — Ambulatory Visit (INDEPENDENT_AMBULATORY_CARE_PROVIDER_SITE_OTHER): Payer: BLUE CROSS/BLUE SHIELD | Admitting: Allergy and Immunology

## 2016-03-31 VITALS — BP 106/66 | HR 88 | Temp 98.6°F | Resp 16

## 2016-03-31 DIAGNOSIS — J310 Chronic rhinitis: Secondary | ICD-10-CM

## 2016-03-31 DIAGNOSIS — J4530 Mild persistent asthma, uncomplicated: Secondary | ICD-10-CM

## 2016-03-31 MED ORDER — MONTELUKAST SODIUM 10 MG PO TABS: ORAL_TABLET | ORAL | Status: DC

## 2016-03-31 MED ORDER — CETIRIZINE HCL 10 MG PO TABS: ORAL_TABLET | ORAL | Status: DC

## 2016-03-31 NOTE — Patient Instructions (Signed)
   Continue Singulair daily.  Zyrtec 10mg  once daily as needed.  ProAir respiclick 2 puffs every 4 hours as needed.  Call with any ProAir use.  Follow-up in 6-9 months or sooner if needed.

## 2016-03-31 NOTE — Progress Notes (Signed)
     FOLLOW UP NOTE  RE: Carolyn MinaLauren R Bogue MRN: [id number] DOB: [birth year] [facility] [address] [place] [id number] Date of Office Visit: 03/31/2016  Subjective:  Carolyn [name] is a 25 y.o. female who presents today for Asthma and Medication Refill  Assessment:   1. Mild persistent asthma, currently asymptomatic.   2. Chronic rhinitis, well controlled.   3.      Complex medical history, followed by cardiology and endocrinology. Plan:   Meds ordered this encounter  Medications  . montelukast (SINGULAIR) 10 MG tablet    Sig: Take one tablet each evening at bedtime to prevent cough or wheeze.    Dispense:  90 tablet    Refill:  1  . cetirizine (ZYRTEC) 10 MG tablet    Sig: Take one tablet daily for runny nose or itching.    Dispense:  90 tablet    Refill:  1  1.  Continue Singulair daily. 2.  Zyrtec 10mg  once daily as needed. 3.  ProAir respiclick 2 puffs every 4 hours as needed. 4.  Call with any ProAir use. 5.  Follow-up in 9 months or sooner if needed.  HPI: Carolyn [name] returns to the office with her father in follow-up of chronic rhinitis and asthma and has done well since her last visit in December 2016.  She usually walks in the neighborhood several days of the week for approximately an hour without difficulty, denies any cough, wheeze, shortness of breath or need for albuterol.  She denies any recurring nasal congestion, drainage drip, headache, sore throat or fever.  She uses Zyrtec most days and is pleased with her regime.  Dad has no additional questions or concerns and is requesting medication refills.  Continues to follow with her other specialists without new medical issues.  Denies ED or urgent care visits, prednisone or antibiotic courses. Reports sleep and activity are normal.  Denies any reflux.  Carolyn [name] has a current medication list which includes the following prescription(s): albuterol, cetirizine, levothyroxine, montelukast,   and omeprazole.   Drug Allergies: Allergies  Allergen Reactions  . Clarithromycin Hives  . Codeine   . Morphine And Related   . Omnicef [Cefdinir]   . Penicillins    Objective:   Filed Vitals:   03/31/16 1533  BP: 106/66  Pulse: 88  Temp: 98.6 F (37 C)  Resp: 16   SpO2 Readings from Last 1 Encounters:  03/31/16 98%   Physical Exam  Constitutional: She is well-developed, well-nourished, and in no distress.  HENT:  Head: Atraumatic.  Right Ear: Tympanic membrane and ear canal normal.  Left Ear: Tympanic membrane and ear canal normal.  Nose: Mucosal edema present. No rhinorrhea. No epistaxis.  Mouth/Throat: Oropharynx is clear and moist and mucous membranes are normal. No oropharyngeal exudate, posterior oropharyngeal edema or posterior oropharyngeal erythema.  Neck: Neck supple.  Cardiovascular: Normal rate, S1 normal and S2 normal.   No murmur heard. Pulmonary/Chest: Effort normal. She has no wheezes. She has no rhonchi. She has no rales.  Lymphadenopathy:    She has no cervical adenopathy.   Diagnostics: Spirometry:  FVC 2.09--63%, FEV1 1.89--64%.   ACT=24.    Rielynn Trulson M. Willa RoughHicks, MD  cc: Lucio EdwardShilpa Gosrani, MD

## 2016-04-04 ENCOUNTER — Other Ambulatory Visit: Payer: Self-pay | Admitting: "Endocrinology

## 2016-04-26 LAB — VITAMIN D 25 HYDROXY (VIT D DEFICIENCY, FRACTURES): Vit D, 25-Hydroxy: 67 ng/mL (ref 30–100)

## 2016-04-26 LAB — ESTRADIOL: Estradiol: 92 pg/mL

## 2016-04-26 LAB — T4, FREE: Free T4: 1.6 ng/dL (ref 0.8–1.8)

## 2016-04-26 LAB — T3, FREE: T3, Free: 2.8 pg/mL (ref 2.3–4.2)

## 2016-04-26 LAB — TSH: TSH: 2.2 mIU/L

## 2016-04-26 LAB — PTH, INTACT AND CALCIUM
Calcium: 9.1 mg/dL (ref 8.4–10.5)
PTH: 30 pg/mL (ref 14–64)

## 2016-05-09 ENCOUNTER — Ambulatory Visit: Payer: BLUE CROSS/BLUE SHIELD | Admitting: "Endocrinology

## 2016-05-17 ENCOUNTER — Encounter: Payer: Self-pay | Admitting: *Deleted

## 2016-05-18 ENCOUNTER — Encounter: Payer: Self-pay | Admitting: "Endocrinology

## 2016-05-18 ENCOUNTER — Ambulatory Visit (INDEPENDENT_AMBULATORY_CARE_PROVIDER_SITE_OTHER): Payer: BLUE CROSS/BLUE SHIELD | Admitting: "Endocrinology

## 2016-05-18 VITALS — BP 110/69 | HR 67 | Wt 172.2 lb

## 2016-05-18 DIAGNOSIS — E038 Other specified hypothyroidism: Secondary | ICD-10-CM | POA: Diagnosis not present

## 2016-05-18 DIAGNOSIS — E669 Obesity, unspecified: Secondary | ICD-10-CM

## 2016-05-18 DIAGNOSIS — R7303 Prediabetes: Secondary | ICD-10-CM

## 2016-05-18 DIAGNOSIS — I10 Essential (primary) hypertension: Secondary | ICD-10-CM

## 2016-05-18 DIAGNOSIS — K219 Gastro-esophageal reflux disease without esophagitis: Secondary | ICD-10-CM

## 2016-05-18 DIAGNOSIS — E063 Autoimmune thyroiditis: Secondary | ICD-10-CM

## 2016-05-18 DIAGNOSIS — Q51811 Hypoplasia of uterus: Secondary | ICD-10-CM

## 2016-05-18 DIAGNOSIS — N910 Primary amenorrhea: Secondary | ICD-10-CM

## 2016-05-18 DIAGNOSIS — E559 Vitamin D deficiency, unspecified: Secondary | ICD-10-CM

## 2016-05-18 DIAGNOSIS — E049 Nontoxic goiter, unspecified: Secondary | ICD-10-CM

## 2016-05-18 LAB — GLUCOSE, POCT (MANUAL RESULT ENTRY): POC Glucose: 89 mg/dl (ref 70–99)

## 2016-05-18 LAB — POCT GLYCOSYLATED HEMOGLOBIN (HGB A1C): Hemoglobin A1C: 5.6

## 2016-05-18 MED ORDER — LEVOTHYROXINE SODIUM 100 MCG PO TABS: ORAL_TABLET | ORAL | 4 refills | Status: DC

## 2016-05-18 NOTE — Progress Notes (Signed)
Chief complaint: Follow-up of primary amenorrhea, congenital hypoplastic uterus, acquired autoimmune hypothyroidism, Hashimoto's thyroiditis, goiter, kyphosis, scoliosis, osteopenia, s/p repair of double outlet right ventricle, and gastroesophageal reflux disease  History of present illness: The patient is a 25 year-old Caucasian young woman. She was accompanied by her father.  1. We have followed this young woman since 10/28/2010 when she was referred by Dr. Crawford Givens, [facility] Gynecology, for evaluation of hypothyroidism and primary amenorrhea.   A. At age 77 months the child was noted to be somewhat slow to walk. She was referred to a pediatric neurologist, Dr. Wyline Copas, for further evaluation. Dr. Gaynell Face noted some minor gross motor and fine motor delays, but also noted a significant cardiac murmur. 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 After several years of follow-up, the patient underwent corrective surgery in 2009. She has done well clinically since that time.  B. 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 The geneticist noted the presence of a scoliosis and some limb length discrepancy. She had flattening of the left occipital region as compared to the right. Her scalp hair was thin, especially sparse in the temporal regions. The forehead was flat. She had short palpebral fissures and a wide nasal bridge. Epicanthal folds were present. She had a thin upper lip and some underbite. She also had facial asymmetry with the right cheek being more prominent than the left. The lower lip was also more prominent on the right than on the left. The neck was short and wide. The nipples were felt to be widely spaced. Labia majora were  slightly hypoplastic. She had mild soft tissue syndactyly between the third and fourth digits bilaterally. The right lower extremity was longer than the left. The right foot was longer than the left foot. The right thigh was somewhat longer than the left thigh. The right leg was longer in length compared to the left. All of the right toes were larger than the left toes. Left nails were smaller and dystrophic. She also had a thoracolumbar scoliosis with convexity to the right. She walked with a tilt of her pelvis to the left side. 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 Because the clinical findings suggested the possibility of a genetic disorder, a karyotype was obtained. She had a 60, XX karyotype. A FISH study was also performed. This study showed no evidence for the common form of DiGeorge Syndrome. The geneticist was not able to make a specific genetic diagnosis. Although the geneticist requested that the family return for a follow-up examination, it appears that did not happen.  C. On 09/03/10 the patient was evaluated at [facility]. The reason for referral was primary amenorrhea at age 73. The patient's breasts were noted to be Tanner stage III. The vagina looked normal externally. The patient was unable to tolerate either a speculum exam or pelvic exam. Pregnancy test was negative. Laboratory data showed a TSH of 9.296, free T4 0.91, and T3 of 126.5. Her TSH was definitely elevated. The free T4 and T3 were in the lower portion of the normal range. FSH was very elevated at  92.5. Prolactin was 4.2. Repeat karyotype was 11, XX. A pelvic ultrasound was performed in the [facility] office.  The uterus and ovaries were not clearly seen. Endometrial stripe was not seen. It was commented that additional imaging may be necessary.  D. On 10/28/10 the patient was seen by our former physician assistant, who obtained additional  history to include past issues with allergies, asthma, and gastroesophageal reflux disease. At that point the patient was taking Singulair daily, Advair daily, omeprazole daily, and cetirizine (Zyrtec) as needed. On examination the patient weighed 148 pounds which was at the 80th percentile. Her height was at the 8th percentile. Her blood pressure was 124/80 and heart rate was 68. She did have somewhat dysmorphic facies. A mildly enlarged, but diffusely enlarged, nontender goiter was noted. Laboratory data showed a TSH of 7.517, free T4 of 1.02, and free T3 of 3.4. FSH was 90 and LH was 35. Testosterone was 23.56. Estradiol was 17.2. She definitely had both primary hypogonadism and primary hypothyroidism. At that point she was started on Synthroid, 25 mcg per day.  2. Upon our physician assistant's departure at the end of March 2012, the patient was rescheduled to see me on 01/31/2011. She was taking her Synthroid 25, mcg per day at that time. Her goiter was approximately 20-25 grams in size. The thyroid was non-tender. She was still amenorrheic. Thyroid tests done on that day showed a TSH of 1.667, free T4  1.26, and free T3 3.5. These tests were mid-range normal. Her TPO antibody was elevated at 299, c/w Hashimoto's thyroiditis. She was trying to follow our  Eat Right Diet plan. She was also walking at least an hour per day. She had lost 16 pounds. At her visit on 08/30/11, I met with the patient and her father.The patient was then taking Synthroid, 37.5 mcg/day.  I learned that her mother was not attending the visits in our clinic on the building's third floor because the mother was very afraid of heights and elevators. I made arrangements to see the patient and parents in the first floor radiology conference room on 09/13/11. Dr. Janeal Holmes, MD, PhD, our staff geneticist, joined me on that visit and we provided education to the family about possible Turner's Syndrome mosaicism, hypothyroidism, and  Hashimoto's diease.   3. On 03/15/12 Dr. Abelina Bachelor and I contacted Dr. Freddy Jaksch, pediatric endocrinologist at [facility] and a nationally recognized expert on Turner's Syndrome. We presented Ruvi's case to Dr. Melina Fiddler. Although Dr. Melina Fiddler agreed that Ander Purpura likely had a genetic basis for her complex congenital heart disease and other issues, Dr. Melina Fiddler did not feel that Vella had Turner's mosaicism, but she could not identify any other recognized syndrome that would fit Emmamarie. When I asked Dr. Melina Fiddler her opinion as to whether or not we need to surgically define the gonads before beginning hormone replacement therapy, Dr. Melina Fiddler stated that we probably did not need to do so, but she did understand my concern that if an ovotestis or ovotestes were present, it/they should be removed due to the risk of possible later development of gonadal cancer. Dr. Melina Fiddler suggested that when we are ready to start hormone replacement therapy we use Vivelle-Dot transdermal estradiol, beginning with a dose of 25 mcg/day and gradually increasing the dose to 100 mcg/day over 1-2 years time. We should then wait for spontaneous occurrence of menses.  We could then add a progestin, such as Prometrium, on days 1-10 of each month.     4. In the succeeding four years Raven's Hashimoto's disease T lymphocytes have gradually but progressively destroyed more thyrocytes. In response, we have  gradually increased the patient's Synthroid dosage to 88 mcg/day in order to keep her euthyroid. We have also been gradually increasing the strength of her Vivelle-Dot patches.  5. The patient's last [facility] clinic visit was on 01/04/16. In the interim she has been healthy.   A. She has not noticed any changes in breast size or breast tenderness. She started having vaginal bleeding between November 2016 and January 2017. Bleeding occurs about once every 1-2 months and may last for up to one day.   B. She remains on the mini-Vivelle  0.100 patch every 3-4 days, Synthroid 88 mcg/day, omeprazole, 20 mg, twice daily, and Citracal D twice daily.  C. She is eating less and is walking about one hour per day.   6. Pertinent Review of Systems: Constitutional: The patient feels "good". She is healthy and has no significant complaints. Eyes: Vision is good. There are no significant eye complaints. Neck: The patient has no complaints of anterior neck swelling, soreness, tenderness,  pressure, discomfort, or difficulty swallowing.  Heart: The patient has no complaints of palpitations, irregular heat beats, chest pain, or chest pressure. She saw her [facility] pediatric cardiologist, Dr Ermalene Searing, in mid-October 2014. He gave hear a clean bill of health. Her next scheduled FU will be in 2019. Gastrointestinal: Bowel movents seem normal. The patient has no complaints of excessive hunger, acid reflux, upset stomach, stomach aches or pains, diarrhea, or constipation. Legs: Muscle mass and strength seem normal. There are no complaints of numbness, tingling, burning, or pain. No edema is noted. Feet: There are no obvious foot problems. There are no complaints of numbness, tingling, burning, or pain. No edema is noted. Hair: She says that she is still losing hair, but is not having any bare spots.    PAST MEDICAL, FAMILY, AND SOCIAL HISTORY:  1. School and Family: She is not in school anymore. She remains unemployed. She does not snack at night as much as she used to. Her mother is being treated with some rare form of chemotherapy for her worsening lung cancer. Mom was recently in the hospital for 10 days due to pericardial and pleural effusions. Mom was discharged to home under the care of hospice. Both dad and paternal grandmother have thin hair. 2. Activities: She walks 60 minutes daily when the weather is good.   3. Tobacco, alcohol, and illicit drugs: None 4. PCP: Dr. Saddie Benders 5. GYN: Dr. Gwynneth Munson Dillard 6. Allergy: She has not had many  symptoms since her last visit.  7. Genetics: Dr. Janeal Holmes  REVIEW OF SYSTEMS: There are no other significant problems involving her other body systems.  PHYSICAL EXAM: BP 110/69   Pulse 67   Wt 172 lb 3.2 oz (78.1 kg)  She has lost another 5 pounds since last visit.  Constitutional: The patient looks healthy and upbeat. She is bright today, is very interested in the issues we are discussing, and is more verbal. Her insight appears to be even better than it was at our last visit.   Facies remain asymmetric/dysmorphic. The mouth is somewhat twisted rotationally, as before.  Eyes: There is no arcus or proptosis.  Mouth: The oropharynx appears normal. The tongue appears normal. There is normal oral moisture. There is no obvious gingivitis. Neck: There are no bruits present. The thyroid gland is low-lying.The thyroid gland is again enlarged at about 22 grams in size. The right lobe is only mildly enlarged today. The left lobe is larger than at her last visit. The  consistency of the thyroid gland is normal. There is no thyroid tenderness to palpation. Lungs: The lungs are clear. Air movement is good. Heart: The heart rhythm and rate appear normal. Heart sounds S1 and S2 are normal. I do not appreciate any pathologic heart murmurs.  Abdomen: The abdomen is still enlarged today. Bowel sounds are normal. The abdomen is soft and non-tender. There is no obviously palpable hepatomegaly, splenomegaly, or other masses.  Arms: Muscle mass appears appropriate for age.  Hands: There is no obvious tremor. Phalangeal and metacarpophalangeal joints appear normal. Palms are normal. Legs: Muscle mass appears appropriate for age. There is no edema.  Neurologic: Muscle strength is normal for age and gender  in both the upper and the lower extremities. Muscle tone appears normal. Sensation to touch is normal in the legs. Hair: Her hair at the vertex is still relatively thin, but thicker over time, c/w estrogen  effect. There are no bare spots.   Laboratory data:     Labs 05/18/16: HbA1c 5.6%  Labs 04/25/16: TSH 2.20, free T4 1.6, free T3 2.8; PTH 30, calcium 9.1, 25-OH vitamin D 67;  estradiol 92  Labs 01/04/16: HbA1c 5.5%  Labs 12/28/15: TSH 0.83, free T4 1.7, free T3 3.0; PTH 54, calcium 9.2, 25-OH vitamin D 21; CMP normal; LH 2.0, FSH 6.3,estradiol 119,  testosterone  43, free testosterone 8.5  08/17/15: HbA1c 5.6%  Labs 08/10/15: TSH 0.942, free T4 1.49, free T3 2.7; LH 2.3, FSH 13.1, testosterone 69, estradiol 71.0; PTH 44, calcium 9.3, 25-OH vitamin D 19  Labs 04/02/15: HbA1c was 6.0%. PTH 36, calcium 9.5, 25-OH vitamin D 20; LH 6.6, FSH 13, estradiol 84.8, testosterone 37; TSH 2.152, free T4 1.31, free T3 2.9  Labs 11/24/14: HbA1c was 5.7% today, compared with 6.1% at last visit and with 5.8% at the visit prior; calcium 9.2, PTH 49; 25-OH vitamin D 22; TSH 0.449, free T4 1.54, free T3 3.5; testosterone 41, estradiol 34.4  Labs 07/21/14: CMP normal; estradiol 30.2, FSH 64.1, LH 30.1, testosterone 41; TSH 3.596, free T4 1.75, free T3 3.3; PTH 42, calcium 9.4, 215-hydroxy vitamin D 33  Labs 12/24/13: TSH 0.768, free T4 1.42, free T3 3.0; estradiol 52.3, FSH 26.6, LH 10.3, testosterone 46; CMP normal   Labs 08/07/13: BMP normal, with calcium 9.5; estradiol 22.7; FSH 46.7, LH 18.3; TSH 1.787, free T4 1.63, free T3 3.1; testosterone 43   Labs 05/03/13: Estradiol 17.5, testosterone 66, free testosterone 14.9, LH 44.8, FSH 91.4, 25-hydroxy vitamin D 32, 1,25-dihydroxy vitamin D 55  Labs 01/18/13: BMP normal, to include calcium of 9.9; TSH 1.156, free T4 1.51, free T3 3.1; testosterone 46 (increased from 33.05), estradiol 16.2 (increased from < 11.8); LH 34.4 (decreased from 36.5), FSH 86.3 (decreased from 106.1); PTH 36.8, phosphorus 4.0, 25-hydroxy vitamin D 27   Labs 10/12/12: TSH 2.079, free T4 1.44, free T3 2.9, LH 36.5,  FSH 106.2, estradiol <11.8, Testosterone 29.94, calcium 9.8, phosphorus 4.5,  25-vitamin D 29,   Labs 05/09/12: TSH 0.351, free T4 1.73, free T3 3.1   Labs 03/05/12: TSH 3.236, free T4 1.60, free T3 3.1   Labs 12/14/11: TSH 3.8, free T4 1.39, free T3 3.3, TPO 381, LH 33.4, FSH 86.9, testosterone 33.5, free testosterone 7.2 (normal 1.0-5.0)  Estradiol 13.6,                Labs 08/22/11: TSH 3.183, free T4 1.40, free T3 3.4. testosterone 34.45, estradiol 14.8     Buccal smear - normal  IMAGING:   MRI of pelvis 10/05/15: Uterus has divergent horns, but normal outer fundal contour. Associated fundal thickening is c/w septate uterus. Right ovary measures 11 x 8 mm and has a small simple cyst/follicle. Left ovary measures 11 x 8 mm aid is poorly evaluated but grossly unremarkable.   US pelvis 08/27/15: Uterus measured 9.1 x 3.2 x 4.8 cm. Endometrial thickness was 14 mm. Right ovary was not well visualized. Left ovary measured 2.2 x 2.0 x 2.1 cm. The left ovary appeared normal. There was a non-specific hypoechoic area within the right adnexal region that might be secondary to overlying bowel structures.  BMD 05/29/13: BMD in spine was > 50%. BMD in the hip was above the 95%. Z-scores were 0.3 and 1.68 respectively.   ASSESSMENT: 1. Hypothyroidism: The patient is euthyroid again this month, but now down to about the 25% of the normal range. She needs a small increase in her Synthroid dose.   2. Hashimoto's thyroiditis: Her Hashimoto's disease is clinically quiescent, but intermittently active. The pattern of all three TFTs shifting in parallel together in the same direction, upward or downward, that occurred between October 2014 and March 2015, is pathognomonic for having had an interim flare up of Hashimoto's thyroiditis.  3. Goiter: The left lobe of the thyroid gland is larger today. The waxing and waning of thyroid gland size is also c/w evolving Hashimoto's disease.  4. Primary Amenorrhea/Congenital small (hypoplastic) uterus:   A. When she became euthyroid, she had increases  in [facility] and [place] into the menopausal range. Since initiating estradiol therapy, however, her LH and FSH have decreased and her testosterone and estradiol have increased. Some of these increases may have been due to increased ovarian production of these hormones. Some of the increased testosterone may have been caused by the effects of overly fat adipose cell cytokines causing insulin resistance and hyperinsulinemia, the hyperinsulinemia in turn causing elevated testosterone. Some of the increased estradiol may have been due to increased aromatization of testosterone by adipose cells.   B. When we began estrogen replacement therapy, our immediate estrogen replacement goals were three-fold:   1. To promote estrogenization of breasts and vaginal tissues   2. To promote feminization of her brain and body overall   3. To improve bone mineral density   C. Trellis has responded fairly well to [place]. She is now having vaginal bleeding. We will continue her current dose of mini-Vivelle again today.  We will reassess her clinical exam and lab tests every 3-4 months. I called Dr. Berneta Sages office today and left a voicemail message asking her to return my call to determine when or if [place] might need progesterone therapy.  5. Obesity: Patient's weight has decreased again. She has been exercising more and eating less. She has probably gained 4-5 pounds of muscle and lost 5-10 pounds of fat. She needs to continue to burn off more calories than she takes in.  6. Developmental delays: As noted in my November 2012 note, the patient had an IQ test which gave her an IQ of 8. In many prior visits her interactions were c/w that IQ or perhaps a somewhat lower IQ.  At this visit she is much more engaged and acts quite normal.  7. GERD: Her GERD and dyspepsia are well-controlled with omeprazole. At this point she shows no signs of tachyphylaxis.  8. Kyphosis: Her repeat scoliosis series did not show progression. In fact,  there was a suggestion of improvement. 9. Hypertension: Her systolic BP  and diastolic BPs are much lower today. She needs to continue daily exercise and loss of fat weight.  10. Pre-diabetes: Her HbA1c is a bit higher, but still within normal limits after losing fat weight. Continuing to eat right,  exercise, and lose fat weight will help.  11. Vitamin D deficiency disease: Her vitamin D level has increased nicely since starting [employer], 50,000 IU per week.  PLAN: 1. Diagnostic: Repeat the testosterone, estradiol, LH, and FSH, plus 25-hydroxy vitamin D, calcium, and PTH in 4 months.  2. Therapeutic: Continue the mini-Vivelle at 0.100 mg patch every 3-4 days.  Continue omeprazole, 20 mg, twice daily. Take one Citracal-D, twice daily, at lunch and dinner.  Continue Biotech one 50,00 IU capsule once a week. Add 1-2 Tums at bedtime. Increase the Synthroid dose to 100 mcg/day. 3. Patient education: We discussed the fact that we started ERT in order to promote feminization and to achieve as much bone mineralization as possible. The uterus may or may not grow. We discussed the possible need for progestin therapy. We have previously discussed the possible need for hysterectomy and/or gonadectomy to remove any possible ovotesticular tissue, but her recent MRI  showed no evidence for abnormal ovaries, so surgery will probably not be needed. 4. Follow-up: Follow-up visit in 4 months.  Level of Service: This visit lasted in excess of 50 minutes. More than 50% of the visit was devoted to counseling.  Sherrlyn Hock

## 2016-05-18 NOTE — Patient Instructions (Signed)
Follow up visit in 3 months. Please repeat lab tests 1-2 weeks prior.  

## 2016-06-25 IMAGING — US US PELVIS COMPLETE
1 series · 3 of 3 positions shown · non-contrast
Comparison: Pelvic ultrasound 08/22/2011

CLINICAL DATA: Patient with congenitally small uterus and primary
amenorrhea.

EXAM:
TRANSABDOMINAL ULTRASOUND OF PELVIS
TECHNIQUE: Transabdominal ultrasound examination of the pelvis was performed
including evaluation of the uterus, ovaries, adnexal regions, and
pelvic cul-de-sac.

[Series 1: us pelvis complete · 0.24mm/px · 3 of 3 slices shown]
[im 1/3]
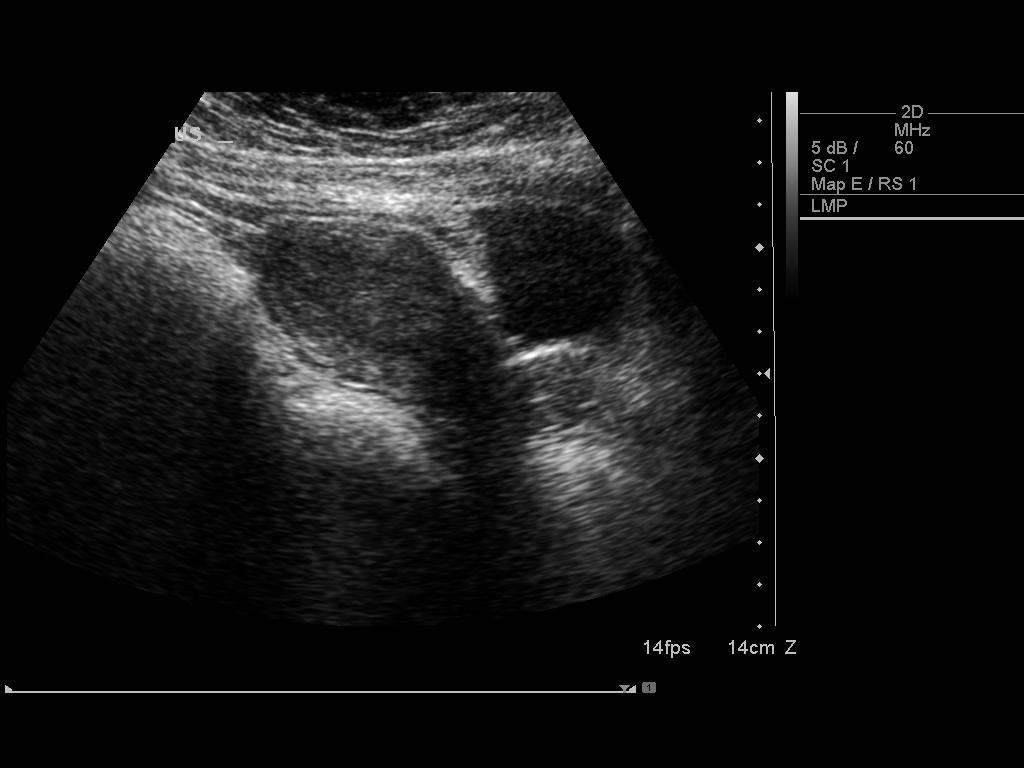
[im 2/3]
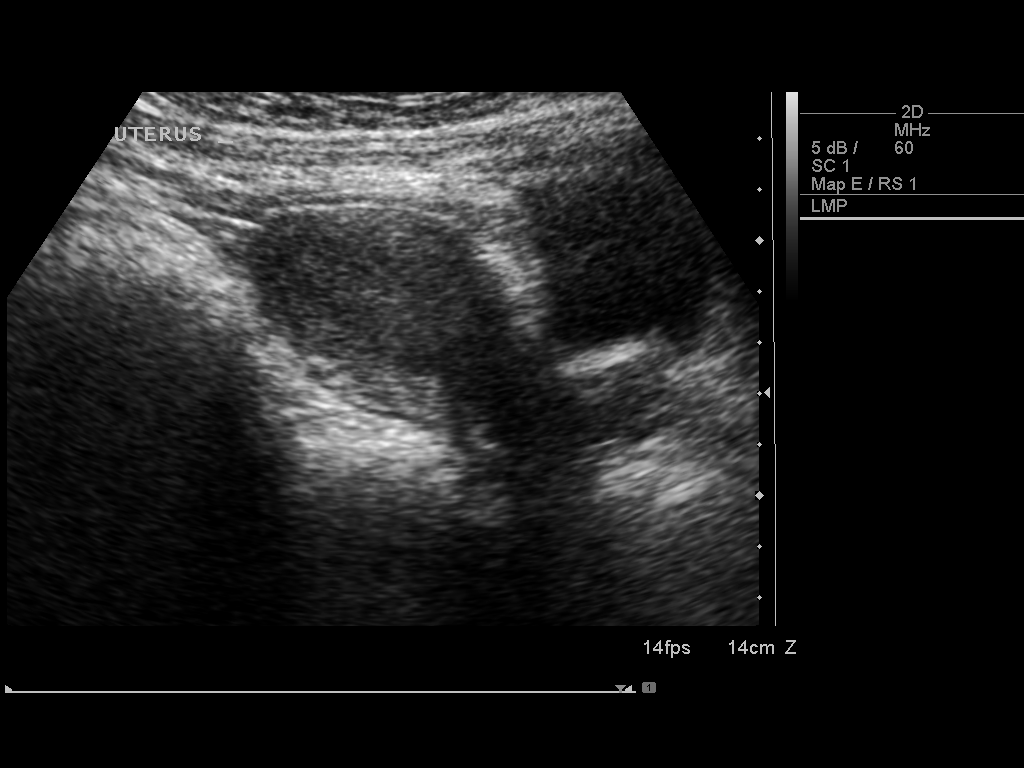
[im 3/3]
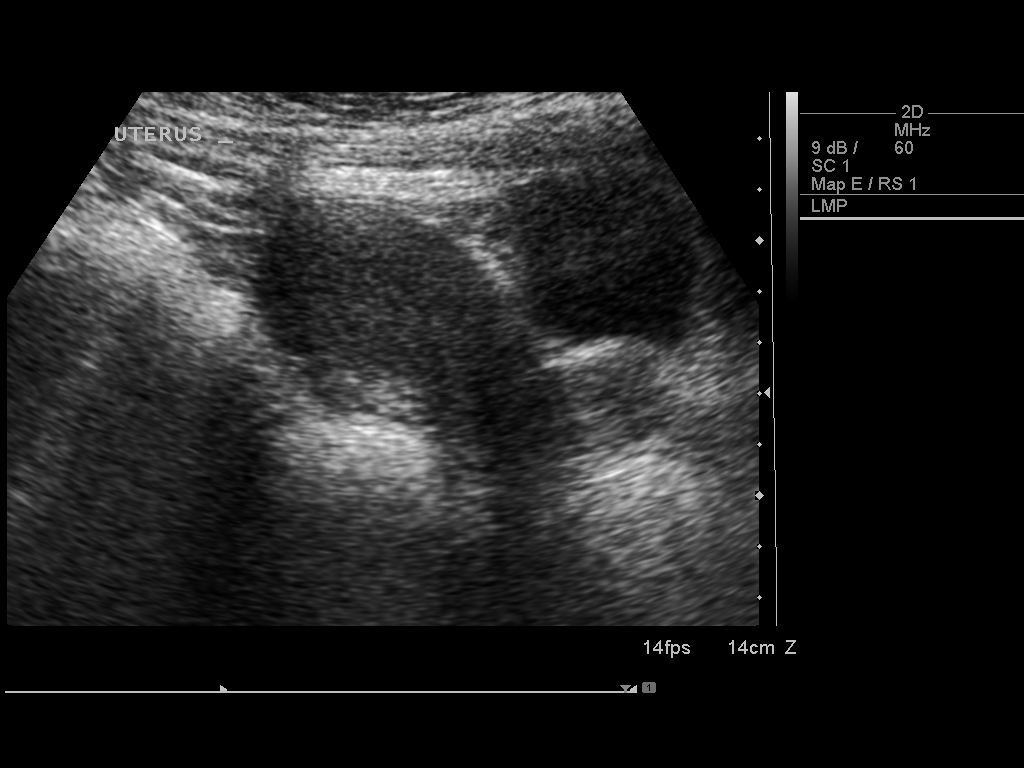

[3 of 3 positions shown; findings below may reference images not displayed]

FINDINGS: Uterus

Measurements: 9.1 x 3.2 x 4.8 cm. No fibroids or other mass
visualized.

Endometrium

Thickness: 14 mm.  No focal abnormality visualized.

Right ovary

The right ovary is not definitely visualized. There is a larger
hypoechoic area within the right adnexal region which is nonspecific
and may be secondary to overlying bowel structures. Adnexal mass not
entirely excluded.

Left ovary

Measurements: 2.2 x 2.0 x 2.1 cm. Normal appearance/no adnexal mass.

Other findings:  No free fluid
IMPRESSION: The right ovary is not definitely visualized. There is a larger
hypoechoic area within the right adnexa which is nonspecific and may
be secondary to overlying bowel gas/ bowel. Adnexal mass cannot be
excluded. Consider dedicated evaluation of the pelvis with either
contrast-enhanced CT or MRI.

The uterus and left ovary are grossly unremarkable.

## 2016-06-27 ENCOUNTER — Other Ambulatory Visit: Payer: Self-pay | Admitting: *Deleted

## 2016-06-27 DIAGNOSIS — N912 Amenorrhea, unspecified: Secondary | ICD-10-CM

## 2016-06-27 MED ORDER — ESTRADIOL 0.1 MG/24HR TD PTTW: MEDICATED_PATCH | TRANSDERMAL | 4 refills | Status: DC

## 2016-08-08 ENCOUNTER — Other Ambulatory Visit (INDEPENDENT_AMBULATORY_CARE_PROVIDER_SITE_OTHER): Payer: Self-pay | Admitting: *Deleted

## 2016-08-08 DIAGNOSIS — E063 Autoimmune thyroiditis: Secondary | ICD-10-CM

## 2016-08-08 DIAGNOSIS — N910 Primary amenorrhea: Secondary | ICD-10-CM

## 2016-08-08 DIAGNOSIS — E559 Vitamin D deficiency, unspecified: Secondary | ICD-10-CM

## 2016-08-08 LAB — TSH: TSH: 0.78 mIU/L

## 2016-08-08 LAB — T3, FREE: T3, Free: 3.1 pg/mL (ref 2.3–4.2)

## 2016-08-08 LAB — T4, FREE: Free T4: 1.6 ng/dL (ref 0.8–1.8)

## 2016-08-09 LAB — FOLLICLE STIMULATING HORMONE: FSH: 5.8 m[IU]/mL

## 2016-08-09 LAB — PTH, INTACT AND CALCIUM
Calcium: 9 mg/dL (ref 8.6–10.2)
PTH: 25 pg/mL (ref 14–64)

## 2016-08-09 LAB — LUTEINIZING HORMONE: LH: 3.1 m[IU]/mL

## 2016-08-09 LAB — ESTRADIOL: Estradiol: 33 pg/mL

## 2016-08-09 LAB — VITAMIN D 25 HYDROXY (VIT D DEFICIENCY, FRACTURES): Vit D, 25-Hydroxy: 56 ng/mL (ref 30–100)

## 2016-08-15 LAB — TESTOS,TOTAL,FREE AND SHBG (FEMALE)
Sex Hormone Binding Glob.: 26 nmol/L (ref 17–124)
Testosterone, Free: 2.5 pg/mL (ref 0.1–6.4)
Testosterone,Total,LC/MS/MS: 16 ng/dL (ref 2–45)

## 2016-08-18 ENCOUNTER — Ambulatory Visit (INDEPENDENT_AMBULATORY_CARE_PROVIDER_SITE_OTHER): Payer: BLUE CROSS/BLUE SHIELD | Admitting: "Endocrinology

## 2016-08-18 VITALS — BP 123/83 | HR 73 | Wt 175.4 lb

## 2016-08-18 DIAGNOSIS — I10 Essential (primary) hypertension: Secondary | ICD-10-CM | POA: Diagnosis not present

## 2016-08-18 DIAGNOSIS — E6609 Other obesity due to excess calories: Secondary | ICD-10-CM | POA: Diagnosis not present

## 2016-08-18 DIAGNOSIS — Q51811 Hypoplasia of uterus: Secondary | ICD-10-CM | POA: Diagnosis not present

## 2016-08-18 DIAGNOSIS — E559 Vitamin D deficiency, unspecified: Secondary | ICD-10-CM | POA: Diagnosis not present

## 2016-08-18 DIAGNOSIS — R7303 Prediabetes: Secondary | ICD-10-CM

## 2016-08-18 DIAGNOSIS — E063 Autoimmune thyroiditis: Secondary | ICD-10-CM | POA: Diagnosis not present

## 2016-08-18 DIAGNOSIS — N910 Primary amenorrhea: Secondary | ICD-10-CM | POA: Diagnosis not present

## 2016-08-18 DIAGNOSIS — E049 Nontoxic goiter, unspecified: Secondary | ICD-10-CM

## 2016-08-18 DIAGNOSIS — E038 Other specified hypothyroidism: Secondary | ICD-10-CM

## 2016-08-18 LAB — POCT GLYCOSYLATED HEMOGLOBIN (HGB A1C): Hemoglobin A1C: 5.7

## 2016-08-18 LAB — GLUCOSE, POCT (MANUAL RESULT ENTRY): POC Glucose: 103 mg/dl — AB (ref 70–99)

## 2016-08-18 NOTE — Patient Instructions (Signed)
Folow up visit in 4 months. Please repeat lab tests about 2 weeks prior.

## 2016-08-18 NOTE — Progress Notes (Signed)
Chief complaint: Follow-up of primary amenorrhea, congenital hypoplastic uterus, acquired autoimmune hypothyroidism, Hashimoto's thyroiditis, goiter, kyphosis, scoliosis, osteopenia, s/p repair of double outlet right ventricle, and gastroesophageal reflux disease  History of present illness: The patient is a 25 year-old Caucasian young woman. She was accompanied by her father.  1. We have followed this young woman since 10/28/2010 when she was referred by Dr. Crawford Givens, [facility], for evaluation of hypothyroidism and primary amenorrhea.   A. At age 13 months the child was noted to be somewhat slow to walk. She was referred to a pediatric neurologist, Dr. Wyline Copas, for further evaluation. Dr. Gaynell Face noted some minor gross motor and fine motor delays, but also noted a significant cardiac murmur. 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 After several years of follow-up, the patient underwent corrective surgery in 2009. She has done well clinically since that time.  B. 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 The geneticist noted the presence of a scoliosis and some limb length discrepancy. She had flattening of the left occipital region as compared to the right. Her scalp hair was thin, especially sparse in the temporal regions. The forehead was flat. She had short palpebral fissures and a wide nasal bridge. Epicanthal folds were present. She had a thin upper lip and some underbite. She also had facial asymmetry with the right cheek being more prominent than the left. The lower lip was also more prominent on the right than on the left. The neck was short and wide. The nipples were felt to be widely spaced. Labia majora were  slightly hypoplastic. She had mild soft tissue syndactyly between the third and fourth digits bilaterally. The right lower extremity was longer than the left. The right foot was longer than the left foot. The right thigh was somewhat longer than the left thigh. The right leg was longer in length compared to the left. All of the right toes were larger than the left toes. Left nails were smaller and dystrophic. She also had a thoracolumbar scoliosis with convexity to the right. She walked with a tilt of her pelvis to the left side. 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 Because the clinical findings suggested the possibility of a genetic disorder, a karyotype was obtained. She had a 78, XX karyotype. A FISH study was also performed. This study showed no evidence for the common form of DiGeorge Syndrome. The geneticist was not able to make a specific genetic diagnosis. Although the geneticist requested that the family return for a follow-up examination, it appears that did not happen.  C. On 09/03/10 the patient was evaluated at [place]. The reason for referral was primary amenorrhea at age 82. The patient's breasts were noted to be Tanner stage III. The vagina looked normal externally. The patient was unable to tolerate either a speculum exam or pelvic exam. Pregnancy test was negative. Laboratory data showed a TSH of 9.296, free T4 0.91, and T3 of 126.5. Her TSH was definitely elevated. The free T4 and T3 were in the lower portion of the normal range. FSH was very elevated at  92.5. Prolactin was 4.2. Repeat karyotype was 41, XX. A pelvic ultrasound was performed in the [facility] office.  The uterus and ovaries were not clearly seen. Endometrial stripe was not seen. It was commented that additional imaging may be necessary.  D. On 10/28/10 the patient was seen by our former physician assistant, who obtained additional  history to include past issues with allergies, asthma, and gastroesophageal reflux disease. At that point the patient was taking Singulair daily, Advair daily, omeprazole daily, and cetirizine (Zyrtec) as needed. On examination the patient weighed 148 pounds which was at the 80th percentile. Her height was at the 8th percentile. Her blood pressure was 124/80 and heart rate was 68. She did have somewhat dysmorphic facies. A mildly enlarged, but diffusely enlarged, nontender goiter was noted. Laboratory data showed a TSH of 7.517, free T4 of 1.02, and free T3 of 3.4. FSH was 90 and LH was 35. Testosterone was 23.56. Estradiol was 17.2. She definitely had both primary hypogonadism and primary hypothyroidism. At that point she was started on Synthroid, 25 mcg per day.  2. Upon our physician assistant's departure at the end of March 2012, the patient was rescheduled to see me on 01/31/2011. She was taking her Synthroid 25, mcg per day at that time. Her goiter was approximately 20-25 grams in size. The thyroid was non-tender. She was still amenorrheic. Thyroid tests done on that day showed a TSH of 1.667, free T4  1.26, and free T3 3.5. These tests were mid-range normal. Her TPO antibody was elevated at 299, c/w Hashimoto's thyroiditis. She was trying to follow our  Eat Right Diet plan. She was also walking at least an hour per day. She had lost 16 pounds. At her visit on 08/30/11, I met with the patient and her father.The patient was then taking Synthroid, 37.5 mcg/day.  I learned that her mother was not attending the visits in our clinic on the building's third floor because the mother was very afraid of heights and elevators. I made arrangements to see the patient and parents in the first floor radiology conference room on 09/13/11. Dr. Janeal Holmes, MD, PhD, our staff geneticist, joined me on that visit and we provided education to the family about possible Turner's Syndrome mosaicism, hypothyroidism, and  Hashimoto's diease.   3. On 03/15/12 Dr. Abelina Bachelor and I contacted Dr. Freddy Jaksch, pediatric endocrinologist at [facility] and a nationally recognized expert on Turner's Syndrome. We presented Marleta's case to Dr. Melina Fiddler. Although Dr. Melina Fiddler agreed that Ander Purpura likely had a genetic basis for her complex congenital heart disease and other issues, Dr. Melina Fiddler did not feel that Valaria had Turner's mosaicism, but she could not identify any other recognized syndrome that would fit Lashika. When I asked Dr. Melina Fiddler her opinion as to whether or not we need to surgically define the gonads before beginning hormone replacement therapy, Dr. Melina Fiddler stated that we probably did not need to do so, but she did understand my concern that if an ovotestis or ovotestes were present, it/they should be removed due to the risk of possible later development of gonadal cancer. Dr. Melina Fiddler suggested that when we are ready to start hormone replacement therapy we use Vivelle-Dot transdermal estradiol, beginning with a dose of 25 mcg/day and gradually increasing the dose to 100 mcg/day over 1-2 years time. We should then wait for spontaneous occurrence of menses.  We could then add a progestin, such as Prometrium, on days 1-10 of each month.     4. In the succeeding five years Erica's Hashimoto's disease T lymphocytes have gradually but progressively destroyed more thyrocytes. In response, we have  gradually increased the patient's Synthroid dosage to 100 mcg/day in order to keep her euthyroid. We have also been gradually increasing the strength of her Vivelle-Dot patches.  5. The patient's last [facility] clinic visit was on 05/18/16. In the interim she has been healthy.   A. Her mother died of metastatic lung cancer 2 1/2 months ago.   BLeotis Shames has not noticed any changes in breast size or breast tenderness. She started having vaginal bleeding between November 2016 and January 2017. Bleeding occurs about once every 2 months  and may last for 1-2 days.   C. She remains on the mini-Vivelle 0.100 patch every 3-4 days, Synthroid 100 mcg/day, omeprazole, 20 mg, twice daily, and Citracal D twice daily.  D. She is eating about the same and is walking about one hour per day.   6. Pertinent Review of Systems: Constitutional: The patient feels "good". She is healthy and has no significant complaints. Eyes: Vision is good. There are no significant eye complaints. Neck: The patient has no complaints of anterior neck swelling, soreness, tenderness,  pressure, discomfort, or difficulty swallowing.  Heart: The patient has no complaints of palpitations, irregular heat beats, chest pain, or chest pressure. She saw her [facility] pediatric cardiologist, Dr Rosiland Oz, in mid-October 2014. He gave hear a clean bill of health. Her next scheduled FU will be in 2019. Gastrointestinal: Bowel movents seem normal. The patient has no complaints of excessive hunger, acid reflux, upset stomach, stomach aches or pains, diarrhea, or constipation. Legs: Muscle mass and strength seem normal. There are no complaints of numbness, tingling, burning, or pain. No edema is noted. Feet: There are no obvious foot problems. There are no complaints of numbness, tingling, burning, or pain. No edema is noted. Hair: She says that her scalp hair seems to be fine. She feels that she is producing and losing about equal amounts of scalp hair.     PAST MEDICAL, FAMILY, AND SOCIAL HISTORY:  1. School and Family: She is not in school anymore. She remains unemployed. Dad and both paternal grandparents had thin hair.  2. Activities: She walks 60 minutes daily when the weather is good.   3. Tobacco, alcohol, and illicit drugs: None 4. PCP: Dr. Lucio Edward 5. GYN: Dr. Samule Ohm Dillard 6. Allergy: She has not had many symptoms since her last visit.  7. Genetics: Dr. Lendon Colonel  REVIEW OF SYSTEMS: There are no other significant problems involving her other body  systems.  PHYSICAL EXAM: BP 123/83   Pulse 73   Wt 175 lb 6.4 oz (79.6 kg)  She has gained 3 pounds since last visit.  Constitutional: The patient looks good today. She was again more interested in our discussions and participated more actively. Her insight appears to be fairly normal.   Facies remain asymmetric/dysmorphic. The mouth is somewhat twisted rotationally, as before.  Eyes: There is no arcus or proptosis.  Mouth: The oropharynx appears normal. The tongue appears normal. There is normal oral moisture. There is no obvious gingivitis. Neck: There are no bruits present. The thyroid gland is low-lying.The thyroid gland is smaller at about 21 grams in size. The right lobe has shrunk back to normal size. The left lobe is still enlarged, but also smaller. The consistency of the thyroid gland is normal. There is no thyroid tenderness to palpation. Lungs: The lungs are clear. Air movement is good. Heart: The heart rhythm and rate appear normal. Heart sounds S1 and S2 are normal. I do not appreciate any  pathologic heart murmurs.  Abdomen: The abdomen is still enlarged today. Bowel sounds are normal. The abdomen is soft and non-tender. There is no obviously palpable hepatomegaly, splenomegaly, or other masses.  Arms: Muscle mass appears appropriate for age.  Hands: There is no obvious tremor. Phalangeal and metacarpophalangeal joints appear normal. Palms are normal. Legs: Muscle mass appears appropriate for age. There is no edema.  Neurologic: Muscle strength is normal for age and gender  in both the upper and the lower extremities. Muscle tone appears normal. Sensation to touch is normal in the legs. Hair: Her hair at the vertex is now fairly thick, c/w estrogen effect. The scalp hair overall is also thicker over time. There are no bare spots.   Laboratory data:     Labs 08/18/16: HbA1c 5.7%  Labs 08/08/16: TSH 0.78, free T4 1.6, free T3 3.1; LH 3.1, FSH 5.8, estradiol 33, testosterone 16;  PTH 25, calcium 9.0, 25-OH vitamin D 56  Labs 05/18/16: HbA1c 5.6%  Labs 04/25/16: TSH 2.20, free T4 1.6, free T3 2.8; PTH 30, calcium 9.1, 25-OH vitamin D 67;  estradiol 92  Labs 01/04/16: HbA1c 5.5%  Labs 12/28/15: TSH 0.83, free T4 1.7, free T3 3.0; PTH 54, calcium 9.2, 25-OH vitamin D 21; CMP normal; LH 2.0, FSH 6.3, estradiol 119,  testosterone  43, free testosterone 8.5  08/17/15: HbA1c 5.6%  Labs 08/10/15: TSH 0.942, free T4 1.49, free T3 2.7; LH 2.3, FSH 13.1, testosterone 69, estradiol 71.0; PTH 44, calcium 9.3, 25-OH vitamin D 19  Labs 04/02/15: HbA1c was 6.0%. PTH 36, calcium 9.5, 25-OH vitamin D 20; LH 6.6, FSH 13, estradiol 84.8, testosterone 37; TSH 2.152, free T4 1.31, free T3 2.9  Labs 11/24/14: HbA1c was 5.7% today, compared with 6.1% at last visit and with 5.8% at the visit prior; calcium 9.2, PTH 49; 25-OH vitamin D 22; TSH 0.449, free T4 1.54, free T3 3.5; testosterone 41, estradiol 34.4  Labs 07/21/14: CMP normal; estradiol 30.2, FSH 64.1, LH 30.1, testosterone 41; TSH 3.596, free T4 1.75, free T3 3.3; PTH 42, calcium 9.4, 215-hydroxy vitamin D 33  Labs 12/24/13: TSH 0.768, free T4 1.42, free T3 3.0; estradiol 52.3, FSH 26.6, LH 10.3, testosterone 46; CMP normal   Labs 08/07/13: BMP normal, with calcium 9.5; estradiol 22.7; FSH 46.7, LH 18.3; TSH 1.787, free T4 1.63, free T3 3.1; testosterone 43   Labs 05/03/13: Estradiol 17.5, testosterone 66, free testosterone 14.9, LH 44.8, FSH 91.4, 25-hydroxy vitamin D 32, 1,25-dihydroxy vitamin D 55  Labs 01/18/13: BMP normal, to include calcium of 9.9; TSH 1.156, free T4 1.51, free T3 3.1; testosterone 46 (increased from 33.05), estradiol 16.2 (increased from < 11.8); LH 34.4 (decreased from 36.5), FSH 86.3 (decreased from 106.1); PTH 36.8, phosphorus 4.0, 25-hydroxy vitamin D 27   Labs 10/12/12: TSH 2.079, free T4 1.44, free T3 2.9, LH 36.5,  FSH 106.2, estradiol <11.8, Testosterone 29.94, calcium 9.8, phosphorus 4.5, 25-vitamin D  29,   Labs 05/09/12: TSH 0.351, free T4 1.73, free T3 3.1   Labs 03/05/12: TSH 3.236, free T4 1.60, free T3 3.1   Labs 12/14/11: TSH 3.8, free T4 1.39, free T3 3.3, TPO 381, LH 33.4, FSH 86.9, testosterone 33.5, free testosterone 7.2 (normal 1.0-5.0)  Estradiol 13.6,                Labs 08/22/11: TSH 3.183, free T4 1.40, free T3 3.4. testosterone 34.45, estradiol 14.8     Buccal smear - normal  IMAGING:   MRI of  pelvis 10/05/15: Uterus has divergent horns, but normal outer fundal contour. Associated fundal thickening is c/w septate uterus. Right ovary measures 11 x 8 mm and has a small simple cyst/follicle. Left ovary measures 11 x 8 mm aid is poorly evaluated but grossly unremarkable.   US pelvis 08/27/15: Uterus measured 9.1 x 3.2 x 4.8 cm. Endometrial thickness was 14 mm. Right ovary was not well visualized. Left ovary measured 2.2 x 2.0 x 2.1 cm. The left ovary appeared normal. There was a non-specific hypoechoic area within the right adnexal region that might be secondary to overlying bowel structures.  BMD 05/29/13: BMD in spine was > 50%. BMD in the hip was above the 95%. Z-scores were 0.3 and 1.68 respectively.   ASSESSMENT: 1. Hypothyroidism: The patient is euthyroid again this month after increasing her Synthroid dose at her last visit, now at about the 75% of the normal range.  2. Hashimoto's thyroiditis: Her Hashimoto's disease is clinically quiescent, but intermittently active. The pattern of all three TFTs shifting in parallel together in the same direction, upward or downward, that occurred between October 2014 and March 2015, is pathognomonic for having had an interim flare up of Hashimoto's thyroiditis.  3. Goiter: Both lobes of the thyroid gland are smaller today. The waxing and waning of thyroid gland size is also c/w evolving Hashimoto's disease.  4. Primary Amenorrhea/Congenital small (hypoplastic) uterus:   A. When she became euthyroid, she had increases in [facility] and [place] into  the menopausal range. Since initiating estradiol therapy, however, her LH and FSH had decreased and her testosterone and estradiol had increased. Some of these increases may have been due to increased ovarian production of these hormones. Some of the increased testosterone may have been caused by the effects of overly fat adipose cell cytokines causing insulin resistance and hyperinsulinemia, the hyperinsulinemia in turn causing elevated testosterone. Some of the increased estradiol may have been due to increased aromatization of testosterone by adipose cells.   B. When we began estrogen replacement therapy, our immediate estrogen replacement goals were three-fold:   1. To promote estrogenization of breasts and vaginal tissues   2. To promote feminization of her brain and body overall   3. To improve bone mineral density   C. Megon has responded fairly well to [place]. She is now having vaginal bleeding. We will continue her current dose of mini-Vivelle again today.  We will reassess her clinical exam and lab tests every 3-4 months.  5. Obesity: Patient's weight has increased again. She has been exercising more and but probably also eating more. She needs to continue to burn off more calories than she takes in.  6. Developmental delays: As noted in my November 2012 note, the patient had an IQ test which gave her an IQ of 43. In many prior visits her interactions were c/w that IQ or perhaps a somewhat lower IQ.  At her last visit and again at today's visit she was much more engaged and acted quite normally.  7. GERD: Her GERD and dyspepsia are well-controlled with omeprazole. At this point she shows no signs of tachyphylaxis.  8. Kyphosis: Her repeat scoliosis series did not show progression. In fact, there was a suggestion of improvement. 9. Hypertension: Her systolic BP and diastolic BPs are much higher today. She needs to eat right, increase daily exercise, and lose fat weight.  10. Pre-diabetes:  Her HbA1c has increased back into the prediabetes range, paralleling her gain in fat weight. As noted above, she  needs to eat right, exercise for an hour a day, and lose fat weight.   11. Vitamin D deficiency disease: Her vitamin D level in October had increased nicely since starting [employer], 50,000 IU per week.  PLAN: 1. Diagnostic: Repeat the testosterone, estradiol, LH, and FSH, plus 25-hydroxy vitamin D, calcium, and PTH in 4 months.  2. Therapeutic: Continue the mini-Vivelle at 0.100 mg patch every 3-4 days.  Continue omeprazole, 20 mg, twice daily. Take one Citracal-D, twice daily, at lunch and dinner.  Continue Biotech one 50,00 IU capsule once a week. Add 1-2 Tums at bedtime. Continue the Synthroid dose to 100 mcg/day. 3. Patient education: We again discussed the fact that we started ERT in order to promote feminization and to achieve as much bone mineralization as possible. We still do not know if the uterus will grow enough for her to be a candidate for in vitro fertilization. We again discussed the possible need for progestin therapy. I told her that I will attempt to contact Dr. Charlesetta Garibaldi again so that we can discuss therapeutic options for Daren.  4. Follow-up: Follow-up visit in 4 months.  Level of Service: This visit lasted in excess of 70 minutes. More than 50% of the visit was devoted to counseling.  Sherrlyn Hock, MD, CDE Adult and Pediatric Endocrinology

## 2016-08-20 ENCOUNTER — Encounter (INDEPENDENT_AMBULATORY_CARE_PROVIDER_SITE_OTHER): Payer: Self-pay | Admitting: "Endocrinology

## 2016-09-30 ENCOUNTER — Ambulatory Visit (INDEPENDENT_AMBULATORY_CARE_PROVIDER_SITE_OTHER): Payer: BLUE CROSS/BLUE SHIELD | Admitting: Allergy

## 2016-09-30 ENCOUNTER — Encounter: Payer: Self-pay | Admitting: Allergy

## 2016-09-30 VITALS — BP 122/78 | HR 70 | Temp 98.7°F | Resp 18 | Ht 62.25 in | Wt 178.0 lb

## 2016-09-30 DIAGNOSIS — J3089 Other allergic rhinitis: Secondary | ICD-10-CM

## 2016-09-30 DIAGNOSIS — J4530 Mild persistent asthma, uncomplicated: Secondary | ICD-10-CM | POA: Diagnosis not present

## 2016-09-30 MED ORDER — CETIRIZINE HCL 10 MG PO TABS: ORAL_TABLET | ORAL | 4 refills | Status: DC

## 2016-09-30 MED ORDER — MONTELUKAST SODIUM 10 MG PO TABS: ORAL_TABLET | ORAL | 4 refills | Status: DC

## 2016-09-30 NOTE — Patient Instructions (Signed)
   Continue Singulair daily.  Zyrtec 10mg  once daily as needed.  ProAir respiclick 2 puffs or albuterol 1 vial via nebulizer every 4 hours as needed.  Call with any ProAir/nebulizer use.  Follow-up in 1 year or sooner if needed.

## 2016-09-30 NOTE — Progress Notes (Signed)
Follow-up Note  RE: Carolyn [name] MRN: [id number] DOB: 08-15-1991 Date of Office Visit: 09/30/2016   History of present illness: Carolyn [name] is a 25 y.o. female presenting today for follow-up of mild persistent asthma, chronic rhinitis.  She was last seen in our office by Dr. Willa RoughHicks on 03/31/2016.  She presents today with her father. Since her last visit she reports she has done very well. She has not needed to use her albuterol inhaler or nebulizer in the past 6 months. She denies any daytime or nighttime symptoms. No ED or urgent care visits, oral steroids or hospitalization for asthma. She continues to take Singulair and Zyrtec daily. She denies any nasal congestion or runny nose or any ocular symptoms. Father reports that since she had her heart surgery about 8 years ago she has been doing very well.     Review of systems: Review of Systems  Constitutional: Negative for chills and fever.  HENT: Negative for congestion, ear pain, sinus pain and sore throat.   Eyes: Negative for discharge and redness.  Respiratory: Negative for cough, shortness of breath and wheezing.   Cardiovascular: Negative for chest pain.  Gastrointestinal: Negative for heartburn, nausea and vomiting.  Skin: Negative for itching and rash.    All other systems negative unless noted above in HPI  Past medical/social/surgical/family history have been reviewed and are unchanged unless specifically indicated below.  No changes  Medication List: Allergies as of 09/30/2016      Reactions   Clarithromycin Hives   Codeine    Morphine And Related    Omnicef [cefdinir]    Penicillins       Medication List       Accurate as of 09/30/16  3:13 PM. Always use your most recent med list.          albuterol 108 (90 Base) MCG/ACT inhaler Commonly known as:  PROVENTIL HFA;VENTOLIN HFA Inhale 2 puffs into the lungs every 4 (four) hours as needed for wheezing or shortness of breath.   albuterol (2.5  MG/3ML) 0.083% nebulizer solution Commonly known as:  PROVENTIL Take 2.5 mg by nebulization every 6 (six) hours as needed.   cetirizine 10 MG tablet Commonly known as:  ZYRTEC Take one tablet daily for runny nose or itching.   estradiol 0.1 MG/24HR patch Commonly known as:  VIVELLE-DOT PLACE 1 PATCH ONTO THE SKIN 2 (TWO) TIMES A WEEK.   levothyroxine 100 MCG tablet Commonly known as:  SYNTHROID, LEVOTHROID Take one 100 mcg brand Synthroid tablet every day.   montelukast 10 MG tablet Commonly known as:  SINGULAIR Take one tablet each evening at bedtime to prevent cough or wheeze.   omeprazole 20 MG capsule Commonly known as:  PRILOSEC Take 1 capsule (20 mg total) by mouth 2 (two) times daily.       Known medication allergies: Allergies  Allergen Reactions  . Clarithromycin Hives  . Codeine   . Morphine And Related   . Omnicef [Cefdinir]   . Penicillins      Physical examination: Blood pressure 122/78, pulse 70, temperature 98.7 F (37.1 C), temperature source Oral, resp. rate 18, height 5' 2.25" (1.581 m), weight 178 lb (80.7 kg), SpO2 96 %.  General: Alert, interactive, in no acute distress. HEENT: TMs pearly gray, turbinates non-edematous without discharge, post-pharynx non erythematous. Neck: Supple without lymphadenopathy. Lungs: Clear to auscultation without wheezing, rhonchi or rales. {no increased work of breathing. CV: Normal S1, S2 without murmurs. Abdomen: Nondistended, nontender. Skin:  Warm and dry, without lesions or rashes. Extremities:  No clubbing, cyanosis or edema. Neuro:   Grossly intact.  Diagnositics/Labs:  Spirometry: FEV1: 2.08L  71%, FVC: 2.34L  71%, ratio consistent with Mild restriction. This appears to be her baseline as her previous study had a slightly decreased function   Assessment and plan:   Mild persistent asthma, currently asymptomatic  -  Continue Singulair daily.  - ProAir respiclick 2 puffs or albuterol 1 vial via  nebulizer every 4 hours as needed.  - Monitor frequency of albuterol use if needed  Chronic rhinitis, well controlled  - Zyrtec 10mg  once daily as needed.  Follow-up in 1 year or sooner if needed.  I appreciate the opportunity to take part in Carolyn [name]'s care. Please do not hesitate to contact me with questions.  Sincerely,   Margo AyeShaylar Padgett, MD Allergy/Immunology [facility]

## 2016-10-07 ENCOUNTER — Encounter: Payer: Self-pay | Admitting: Allergy

## 2016-12-06 ENCOUNTER — Other Ambulatory Visit (INDEPENDENT_AMBULATORY_CARE_PROVIDER_SITE_OTHER): Payer: Self-pay

## 2016-12-06 DIAGNOSIS — E039 Hypothyroidism, unspecified: Secondary | ICD-10-CM

## 2016-12-06 LAB — TSH: TSH: 0.29 mIU/L — ABNORMAL LOW

## 2016-12-06 LAB — T3, FREE: T3, Free: 3.6 pg/mL (ref 2.3–4.2)

## 2016-12-06 LAB — T4, FREE: Free T4: 1.7 ng/dL (ref 0.8–1.8)

## 2016-12-07 LAB — ESTRADIOL: Estradiol: 135 pg/mL

## 2016-12-07 LAB — LUTEINIZING HORMONE: LH: 4 m[IU]/mL

## 2016-12-07 LAB — PTH, INTACT AND CALCIUM
Calcium: 9.1 mg/dL (ref 8.6–10.2)
PTH: 38 pg/mL (ref 14–64)

## 2016-12-07 LAB — VITAMIN D 25 HYDROXY (VIT D DEFICIENCY, FRACTURES): Vit D, 25-Hydroxy: 53 ng/mL (ref 30–100)

## 2016-12-07 LAB — FOLLICLE STIMULATING HORMONE: FSH: 5.5 m[IU]/mL

## 2016-12-08 LAB — TESTOS,TOTAL,FREE AND SHBG (FEMALE)
Sex Hormone Binding Glob.: 33 nmol/L (ref 17–124)
Testosterone, Free: 2.3 pg/mL (ref 0.1–6.4)
Testosterone,Total,LC/MS/MS: 19 ng/dL (ref 2–45)

## 2016-12-19 ENCOUNTER — Encounter (INDEPENDENT_AMBULATORY_CARE_PROVIDER_SITE_OTHER): Payer: Self-pay | Admitting: "Endocrinology

## 2016-12-19 ENCOUNTER — Ambulatory Visit (INDEPENDENT_AMBULATORY_CARE_PROVIDER_SITE_OTHER): Payer: BLUE CROSS/BLUE SHIELD | Admitting: "Endocrinology

## 2016-12-19 VITALS — BP 124/80 | HR 90 | Ht 61.61 in | Wt 172.0 lb

## 2016-12-19 DIAGNOSIS — R7303 Prediabetes: Secondary | ICD-10-CM | POA: Diagnosis not present

## 2016-12-19 DIAGNOSIS — E049 Nontoxic goiter, unspecified: Secondary | ICD-10-CM | POA: Diagnosis not present

## 2016-12-19 DIAGNOSIS — E6609 Other obesity due to excess calories: Secondary | ICD-10-CM | POA: Diagnosis not present

## 2016-12-19 DIAGNOSIS — E038 Other specified hypothyroidism: Secondary | ICD-10-CM | POA: Diagnosis not present

## 2016-12-19 DIAGNOSIS — E063 Autoimmune thyroiditis: Secondary | ICD-10-CM

## 2016-12-19 DIAGNOSIS — I10 Essential (primary) hypertension: Secondary | ICD-10-CM | POA: Diagnosis not present

## 2016-12-19 DIAGNOSIS — K219 Gastro-esophageal reflux disease without esophagitis: Secondary | ICD-10-CM

## 2016-12-19 DIAGNOSIS — E559 Vitamin D deficiency, unspecified: Secondary | ICD-10-CM

## 2016-12-19 DIAGNOSIS — N910 Primary amenorrhea: Secondary | ICD-10-CM

## 2016-12-19 DIAGNOSIS — Q51811 Hypoplasia of uterus: Secondary | ICD-10-CM | POA: Diagnosis not present

## 2016-12-19 LAB — POCT GLYCOSYLATED HEMOGLOBIN (HGB A1C): Hemoglobin A1C: 5.3

## 2016-12-19 LAB — GLUCOSE, POCT (MANUAL RESULT ENTRY): POC Glucose: 95 mg/dl (ref 70–99)

## 2016-12-19 NOTE — Progress Notes (Signed)
Chief complaint: Follow-up of primary amenorrhea, congenital hypoplastic uterus, acquired autoimmune hypothyroidism, Hashimoto's thyroiditis, goiter, kyphosis, scoliosis, osteopenia, s/p repair of double outlet right ventricle, and gastroesophageal reflux disease  History of present illness: The patient is a 26 year-old Caucasian young woman. She was accompanied by her father.  1. We have followed this young woman since 10/28/2010 when she was referred by Dr. Crawford Givens, [facility], for evaluation of hypothyroidism and primary amenorrhea.   A. At age 29 months the child was noted to be somewhat slow to walk. She was referred to a pediatric neurologist, Dr. Wyline Copas, for further evaluation. Dr. Gaynell Face noted some minor gross motor and fine motor delays, but also noted a significant cardiac murmur. 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 After several years of follow-up, the patient underwent corrective surgery in 2009. She has done well clinically since that time.  B. 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 The geneticist noted the presence of a scoliosis and some limb length discrepancy. Siddalee had flattening of the left occipital region as compared to the right. Her scalp hair was thin, especially sparse in the temporal regions. The forehead was flat. She had short palpebral fissures and a wide nasal bridge. Epicanthal folds were present. She had a thin upper lip and some underbite. She also had facial asymmetry with the right cheek being more prominent than the left. The lower lip was also more prominent on the right than on the left. The neck was short and wide. The nipples were felt to be widely spaced. Labia majora  were slightly hypoplastic. She had mild soft tissue syndactyly between the third and fourth digits bilaterally. The right lower extremity was longer than the left. The right foot was longer than the left foot. The right thigh was somewhat longer than the left thigh. The right leg was longer in length compared to the left. All of the right toes were larger than the left toes. Left nails were smaller and dystrophic. She also had a thoracolumbar scoliosis with convexity to the right. She walked with a tilt of her pelvis to the left side. 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 Because the clinical findings suggested the possibility of a genetic disorder, a karyotype was obtained. She had a 28, XX karyotype. A FISH study was also performed. This study showed no evidence for the common form of DiGeorge Syndrome. The geneticist was not able to make a specific genetic diagnosis. Although the geneticist requested that the family return for a follow-up examination, it appears that did not happen.  C. On 09/03/10 the patient was evaluated at [facility]. The reason for referral was primary amenorrhea at age 55. The patient's breasts were noted to be Tanner stage III. The vagina looked normal externally. The patient was unable to tolerate either a speculum exam or pelvic exam. Pregnancy test was negative. Laboratory data showed a TSH of 9.296, free T4 0.91, and T3 of 126.5. Her TSH was definitely elevated. The free T4 and T3 were in the lower portion of the normal range. FSH was very elevated at  92.5. Prolactin was 4.2. Repeat karyotype was 59, XX. A pelvic ultrasound was performed in the [facility] office.  The uterus and ovaries were not clearly seen. Endometrial stripe was not seen. It was commented that additional imaging may be necessary.  D. On 10/28/10 the patient was seen by our former physician assistant, who obtained additional  history to include past issues with allergies, asthma, and gastroesophageal reflux disease. At that point the patient was taking Singulair daily, Advair daily, omeprazole daily, and cetirizine (Zyrtec) as needed. On examination the patient weighed 148 pounds which was at the 80th percentile. Her height was at the 8th percentile. Her blood pressure was 124/80 and heart rate was 68. She did have somewhat dysmorphic facies. A mildly enlarged, but diffusely enlarged, nontender goiter was noted. Laboratory data showed a TSH of 7.517, free T4 of 1.02, and free T3 of 3.4. FSH was 90 and LH was 35. Testosterone was 23.56. Estradiol was 17.2. She definitely had both primary hypogonadism and primary hypothyroidism. At that point she was started on Synthroid, 25 mcg per day.  2. Upon our physician assistant's departure at the end of March 2012, the patient was rescheduled to see me on 01/31/2011. She was taking her Synthroid 25, mcg per day at that time. Her goiter was approximately 20-25 grams in size. The thyroid was non-tender. She was still amenorrheic. Thyroid tests done on that day showed a TSH of 1.667, free T4  1.26, and free T3 3.5. These tests were mid-range normal. Her TPO antibody was elevated at 299, c/w Hashimoto's thyroiditis. She was trying to follow our  Eat Right Diet plan. She was also walking at least an hour per day. She had lost 16 pounds. At her visit on 08/30/11, I met with the patient and her father.The patient was then taking Synthroid, 37.5 mcg/day.  I learned that her mother was not attending the visits in our clinic on the building's third floor because the mother was very afraid of heights and elevators. I made arrangements to see the patient and parents in the first floor radiology conference room on 09/13/11. Dr. Janeal Holmes, MD, PhD, our staff geneticist, joined me on that visit and we provided education to the family about possible Turner's Syndrome mosaicism, hypothyroidism, and  Hashimoto's diease.   3. On 03/15/12 Dr. Abelina Bachelor and I contacted Dr. Freddy Jaksch, pediatric endocrinologist at [facility] and a nationally recognized expert on Turner's Syndrome. We presented Onell's case to Dr. Melina Fiddler. Although Dr. Melina Fiddler agreed that Ander Purpura likely had a genetic basis for her complex congenital heart disease and other issues, Dr. Melina Fiddler did not feel that Breonna had Turner's mosaicism, but she could not identify any other recognized syndrome that would fit Makaiya. When I asked Dr. Melina Fiddler her opinion as to whether or not we need to surgically define the gonads before beginning hormone replacement therapy, Dr. Melina Fiddler stated that we probably did not need to do so, but she did understand my concern that if an ovotestis or ovotestes were present, it/they should be removed due to the risk of possible later development of gonadal cancer. Dr. Melina Fiddler suggested that when we are ready to start hormone replacement therapy we use Vivelle-Dot transdermal estradiol, beginning with a dose of 25 mcg/day and gradually increasing the dose to 100 mcg/day over 1-2 years time. We should then wait for spontaneous occurrence of menses.  We could then add a progestin, such as Prometrium, on days 1-10 of each month.     4. In the succeeding five years Devera's Hashimoto's disease T lymphocytes have gradually but progressively destroyed more thyrocytes. In response, we have  gradually increased the patient's Synthroid dosage to 100 mcg/day in order to keep her euthyroid. We have also been gradually increasing the strength of her Vivelle-Dot patches.  5. The patient's last [facility] clinic visit was on 08/18/16. In the interim she has been healthy.   AAnder Purpura has not noticed any changes in breast size or breast tenderness. She has been having vaginal bleeding almost every day. Bleeding varies in amounts from heavy to light, with more days being light bleeding.    C. She remains on the mini-Vivelle 0.100  patch every 3-4 days, Synthroid 100 mcg/day, omeprazole, 20 mg, twice daily, and Citracal-D twice daily. She also takes Biotech form of vitamin D, 50,000 IU per week.   D. She is eating about the same and is walking about one hour per day.   6. Pertinent Review of Systems: Constitutional: The patient feels "good". She is healthy and has no significant complaints. Eyes: Vision is good. There are no significant eye complaints. Neck: The patient has no complaints of anterior neck swelling, soreness, tenderness,  pressure, discomfort, or difficulty swallowing.  Heart: The patient has no complaints of palpitations, irregular heat beats, chest pain, or chest pressure. She saw her [facility] pediatric cardiologist, Dr Ermalene Searing, in mid-October 2014. He gave hear a clean bill of health. Her next scheduled FU will be in 2019. Gastrointestinal: Bowel movents seem normal. The patient has no complaints of excessive hunger, acid reflux, upset stomach, stomach aches or pains, diarrhea, or constipation. Legs: Muscle mass and strength seem normal. There are no complaints of numbness, tingling, burning, or pain. No edema is noted. Feet: There are no obvious foot problems. There are no complaints of numbness, tingling, burning, or pain. No edema is noted. Hair: She says that her scalp hair seems to be fine. She feels that she is producing and losing about equal amounts of scalp hair.     PAST MEDICAL, FAMILY, AND SOCIAL HISTORY:  1. School and Family: She is not in school anymore. She remains unemployed. Dad and both paternal grandparents had thin hair.  2. Activities: She walks 60 minutes daily when the weather is good.   3. Tobacco, alcohol, and illicit drugs: None 4. PCP: Dr. Saddie Benders 5. GYN: Dr. Gwynneth Munson Dillard 6. Allergy: She has not had many symptoms since her last visit.  7. Genetics: Dr. Janeal Holmes  REVIEW OF SYSTEMS: There are no other significant problems involving her other body  systems.  PHYSICAL EXAM: BP 124/80   Pulse 90   Ht 5' 1.61" (1.565 m)   Wt 172 lb (78 kg)   BMI 31.85 kg/m  Constitutional: The patient looks good today. She cut her hair recently after getting tired of it being too long. She was again very interested in our discussions and participated actively. Her insight appears to be fairly normal. She has lost 3 pounds since last visit.  Facies remain asymmetric/dysmorphic. The mouth is somewhat twisted rotationally, as before.  Eyes: There is no arcus or proptosis.  Mouth: The oropharynx appears normal. The tongue appears normal. There is normal oral moisture. There is no obvious gingivitis. Neck: There are no bruits present. The thyroid gland is low-lying.The thyroid gland is larger at about 22 grams in size. The right lobe has shrunk back to normal size. The left lobe is larger. The consistency of the thyroid gland is normal. There is no thyroid tenderness to palpation. Lungs: The lungs are clear. Air movement is good. Heart: The heart rhythm and rate  appear normal. Heart sounds S1 and S2 are normal. I do not appreciate any pathologic heart murmurs.  Abdomen: The abdomen is still enlarged today. Bowel sounds are normal. The abdomen is soft and non-tender. There is no obviously palpable hepatomegaly, splenomegaly, or other masses.  Arms: Muscle mass appears appropriate for age.  Hands: There is no obvious tremor. Phalangeal and metacarpophalangeal joints appear normal. Palms are normal. Legs: Muscle mass appears appropriate for age. There is no edema.  Neurologic: Muscle strength is normal for age and gender  in both the upper and the lower extremities. Muscle tone appears normal. Sensation to touch is normal in the legs. Hair: Her hair at the vertex is now fairly thick, c/w estrogen effect. After cutting her hair, however, it appears that her hair is somewhat less dense. There are no unusually thin spots or bare spots.   Laboratory data:     Labs  12/19/16: HbA1c 5.3%, CBG 95  Labs 12/06/16: LH 4.0, FSH 5.5, estradiol 135, testosterone 19, free testosterone 2.3; TSH  0.29, free T4 1.7, free T3 3.1; PTH 38, calcium 9.1, vitamin D 53  Labs 08/18/16: HbA1c 5.7%  Labs 08/08/16: TSH 0.78, free T4 1.6, free T3 3.1; LH 3.1, FSH 5.8, estradiol 33, testosterone 16; PTH 25, calcium 9.0, 25-OH vitamin D 56  Labs 05/18/16: HbA1c 5.6%  Labs 04/25/16: TSH 2.20, free T4 1.6, free T3 2.8; PTH 30, calcium 9.1, 25-OH vitamin D 67;  estradiol 92  Labs 01/04/16: HbA1c 5.5%  Labs 12/28/15: TSH 0.83, free T4 1.7, free T3 3.0; PTH 54, calcium 9.2, 25-OH vitamin D 21; CMP normal; LH 2.0, FSH 6.3, estradiol 119,  testosterone  43, free testosterone 8.5  08/17/15: HbA1c 5.6%  Labs 08/10/15: TSH 0.942, free T4 1.49, free T3 2.7; LH 2.3, FSH 13.1, testosterone 69, estradiol 71.0; PTH 44, calcium 9.3, 25-OH vitamin D 19  Labs 04/02/15: HbA1c was 6.0%. PTH 36, calcium 9.5, 25-OH vitamin D 20; LH 6.6, FSH 13, estradiol 84.8, testosterone 37; TSH 2.152, free T4 1.31, free T3 2.9  Labs 11/24/14: HbA1c was 5.7% today, compared with 6.1% at last visit and with 5.8% at the visit prior; calcium 9.2, PTH 49; 25-OH vitamin D 22; TSH 0.449, free T4 1.54, free T3 3.5; testosterone 41, estradiol 34.4  Labs 07/21/14: CMP normal; estradiol 30.2, FSH 64.1, LH 30.1, testosterone 41; TSH 3.596, free T4 1.75, free T3 3.3; PTH 42, calcium 9.4, 215-hydroxy vitamin D 33  Labs 12/24/13: TSH 0.768, free T4 1.42, free T3 3.0; estradiol 52.3, FSH 26.6, LH 10.3, testosterone 46; CMP normal   Labs 08/07/13: BMP normal, with calcium 9.5; estradiol 22.7; FSH 46.7, LH 18.3; TSH 1.787, free T4 1.63, free T3 3.1; testosterone 43   Labs 05/03/13: Estradiol 17.5, testosterone 66, free testosterone 14.9, LH 44.8, FSH 91.4, 25-hydroxy vitamin D 32, 1,25-dihydroxy vitamin D 55  Labs 01/18/13: BMP normal, to include calcium of 9.9; TSH 1.156, free T4 1.51, free T3 3.1; testosterone 46 (increased from  33.05), estradiol 16.2 (increased from < 11.8); LH 34.4 (decreased from 36.5), FSH 86.3 (decreased from 106.1); PTH 36.8, phosphorus 4.0, 25-hydroxy vitamin D 27   Labs 10/12/12: TSH 2.079, free T4 1.44, free T3 2.9, LH 36.5,  FSH 106.2, estradiol <11.8, Testosterone 29.94, calcium 9.8, phosphorus 4.5, 25-vitamin D 29,   Labs 05/09/12: TSH 0.351, free T4 1.73, free T3 3.1   Labs 03/05/12: TSH 3.236, free T4 1.60, free T3 3.1   Labs 12/14/11: TSH 3.8, free T4 1.39, free T3  3.3, TPO 381, LH 33.4, FSH 86.9, testosterone 33.5, free testosterone 7.2 (normal 1.0-5.0)  Estradiol 13.6,                Labs 08/22/11: TSH 3.183, free T4 1.40, free T3 3.4. testosterone 34.45, estradiol 14.8     Buccal smear - normal  IMAGING:   MRI of pelvis 10/05/15: Uterus has divergent horns, but normal outer fundal contour. Associated fundal thickening is c/w septate uterus. Right ovary measures 11 x 8 mm and has a small simple cyst/follicle. Left ovary measures 11 x 8 mm aid is poorly evaluated but grossly unremarkable.   US pelvis 08/27/15: Uterus measured 9.1 x 3.2 x 4.8 cm. Endometrial thickness was 14 mm. Right ovary was not well visualized. Left ovary measured 2.2 x 2.0 x 2.1 cm. The left ovary appeared normal. There was a non-specific hypoechoic area within the right adnexal region that might be secondary to overlying bowel structures.  BMD 05/29/13: BMD in spine was > 50%. BMD in the hip was above the 95%. Z-scores were 0.3 and 1.68 respectively.   ASSESSMENT: 1. Hypothyroidism: The patient is hyperthyroid this month on her current Synthroid dose of 100 mcg/day.  2. Hashimoto's thyroiditis: Her Hashimoto's disease is clinically quiescent, but intermittently active. The pattern of all three TFTs shifting in parallel together in the same direction, upward or downward, that occurred between October 2014 and March 2015, is pathognomonic for having had an interim flare up of Hashimoto's thyroiditis.  3. Goiter: The  thyroid gland is slightly larger today. The waxing and waning of thyroid gland size is also c/w evolving Hashimoto's disease.  4. Primary Amenorrhea/Congenital small (hypoplastic) uterus:   A. When she became euthyroid, she had increases in [facility] and [place] into the menopausal range. Since initiating estradiol therapy, however, her LH and FSH had decreased and her testosterone and estradiol had increased. Some of these increases may have been due to increased ovarian production of these hormones. Some of the increased testosterone may have been caused by the effects of overly fat adipose cell cytokines causing insulin resistance and hyperinsulinemia, the hyperinsulinemia in turn causing elevated testosterone. Some of the increased estradiol may have been due to increased aromatization of testosterone by adipose cells.   B. When we began estrogen replacement therapy, our immediate estrogen replacement goals were three-fold:   1. To promote estrogenization of breasts and vaginal tissues   2. To promote feminization of her brain and body overall   3. To improve bone mineral density   C. Rayla has responded well to [place]. She is now having vaginal bleeding almost every day. We will continue her current dose of mini-Vivelle again today. I will try again to contact Dr. Charlesetta Garibaldi to see if she would like to see Eufelia and would like me to order progesterone. We will reassess her clinical exam and lab tests every 3-4 months.  5. Obesity: Patient's weight has decreased again. She has been exercising more and probably also eating less. She needs to continue to burn off more calories than she takes in.   6. Developmental delays: As noted in my November 2012 note, the patient had an IQ test which gave her an IQ of 52. In many prior visits her interactions were c/w that IQ or perhaps a somewhat lower IQ.  At her last several visits and again at today's visit she was much more engaged and acted quite normally.  7. GERD:  Her GERD and dyspepsia are well-controlled with omeprazole. At  this point she shows no signs of tachyphylaxis.  8. Kyphosis: Her repeat scoliosis series did not show progression. In fact, there was a suggestion of improvement. 9. Hypertension: Her systolic BP is one point higher and her and diastolic BP is three points lower. She needs to eat right, increase daily exercise, and lose fat weight.  10. Pre-diabetes: Her HbA1c has decreased back into the normal range, paralleling her loss in fat weight. As noted above, she needs to eat right, exercise for an hour a day, and lose fat weight.   11. Vitamin D deficiency disease: Her vitamin D level in October had increased nicely since starting Biotech, 50,000 IU per week. Her recent vitamin D level was a bit lower, but still good. even better. I would like to see her calcium levels more into the 9.5-10.0 range.   PLAN: 1. Diagnostic: Repeat the testosterone, estradiol, LH, and FSH, plus 25-hydroxy vitamin D, calcium, and PTH in 4 months.  2. Therapeutic: Continue the mini-Vivelle at 0.100 mg patch every 3-4 days.  Continue omeprazole, 20 mg, twice daily. Take one Citracal-D, twice daily, at lunch and dinner.  Continue Biotech one 50,00 IU capsule once a week. Add 1-2 Tums at bedtime. Reduce the Synthroid dose to 100 mcg/day for 6 days each week, but take only 1/2 pill on Sundays. 3. Patient education: We again discussed the fact that we started ERT in order to promote feminization and to achieve as much bone mineralization as possible. We still do not know if the uterus will grow enough for her to be a candidate for in vitro fertilization. We again discussed the possible need for progestin therapy. I told her that I will attempt to contact Dr. Charlesetta Garibaldi again so that we can discuss therapeutic options for Marifer.  4. Follow-up: Follow-up visit in 4 months.  Level of Service: This visit lasted in excess of 70 minutes. More than 50% of the visit was devoted to  counseling.  Tillman Sers, MD, CDE Adult and Pediatric Endocrinology

## 2016-12-19 NOTE — Patient Instructions (Signed)
Follow up visit in 4 months. Please reduce the Synthroid dose as follows: Take one 100 mcg pill per day for 6 days each week, but take only a 1/2 pill on Sundays. Please repeat lab tests 1-2 weeks prior.

## 2017-02-01 ENCOUNTER — Other Ambulatory Visit: Payer: Self-pay

## 2017-02-01 MED ORDER — CETIRIZINE HCL 10 MG PO TABS: ORAL_TABLET | ORAL | 1 refills | Status: DC

## 2017-04-13 LAB — T3, FREE: T3, Free: 3.3 pg/mL (ref 2.3–4.2)

## 2017-04-13 LAB — ESTRADIOL: Estradiol: 76 pg/mL

## 2017-04-13 LAB — TSH: TSH: 0.65 mIU/L

## 2017-04-13 LAB — LUTEINIZING HORMONE: LH: 4.4 m[IU]/mL

## 2017-04-13 LAB — T4, FREE: Free T4: 1.8 ng/dL (ref 0.8–1.8)

## 2017-04-13 LAB — FOLLICLE STIMULATING HORMONE: FSH: 4.4 m[IU]/mL

## 2017-04-17 LAB — TESTOS,TOTAL,FREE AND SHBG (FEMALE)
Sex Hormone Binding Glob.: 31 nmol/L (ref 17–124)
Testosterone, Free: 2.4 pg/mL (ref 0.1–6.4)
Testosterone,Total,LC/MS/MS: 18 ng/dL (ref 2–45)

## 2017-04-25 ENCOUNTER — Encounter (INDEPENDENT_AMBULATORY_CARE_PROVIDER_SITE_OTHER): Payer: Self-pay | Admitting: "Endocrinology

## 2017-04-25 ENCOUNTER — Ambulatory Visit (INDEPENDENT_AMBULATORY_CARE_PROVIDER_SITE_OTHER): Payer: BLUE CROSS/BLUE SHIELD | Admitting: "Endocrinology

## 2017-04-25 VITALS — BP 130/74 | HR 88 | Wt 167.8 lb

## 2017-04-25 DIAGNOSIS — E669 Obesity, unspecified: Secondary | ICD-10-CM | POA: Diagnosis not present

## 2017-04-25 DIAGNOSIS — R7303 Prediabetes: Secondary | ICD-10-CM | POA: Diagnosis not present

## 2017-04-25 DIAGNOSIS — K219 Gastro-esophageal reflux disease without esophagitis: Secondary | ICD-10-CM

## 2017-04-25 DIAGNOSIS — N910 Primary amenorrhea: Secondary | ICD-10-CM | POA: Diagnosis not present

## 2017-04-25 DIAGNOSIS — I10 Essential (primary) hypertension: Secondary | ICD-10-CM | POA: Diagnosis not present

## 2017-04-25 DIAGNOSIS — E063 Autoimmune thyroiditis: Secondary | ICD-10-CM | POA: Diagnosis not present

## 2017-04-25 DIAGNOSIS — Q51811 Hypoplasia of uterus: Secondary | ICD-10-CM | POA: Diagnosis not present

## 2017-04-25 DIAGNOSIS — E559 Vitamin D deficiency, unspecified: Secondary | ICD-10-CM | POA: Diagnosis not present

## 2017-04-25 DIAGNOSIS — E049 Nontoxic goiter, unspecified: Secondary | ICD-10-CM

## 2017-04-25 LAB — POCT GLUCOSE (DEVICE FOR HOME USE): POC Glucose: 92 mg/dl (ref 70–99)

## 2017-04-25 LAB — POCT GLYCOSYLATED HEMOGLOBIN (HGB A1C): Hemoglobin A1C: 5.5

## 2017-04-25 NOTE — Patient Instructions (Signed)
Follow up visit in 4 months. Please repeat lab tests 1-2 weeks prior. 

## 2017-04-25 NOTE — Progress Notes (Signed)
Chief complaint: Follow-up of primary amenorrhea, congenital hypoplastic uterus, acquired autoimmune hypothyroidism, Hashimoto's thyroiditis, goiter, kyphosis, scoliosis, osteopenia, s/p repair of double outlet right ventricle, and gastroesophageal reflux disease  History of present illness: The patient is a 26 year-old Caucasian young woman. She was accompanied by her father.  1. We have followed this young woman since 10/28/2010 when she was referred by Dr. Crawford Givens, [facility] and Gynecology, for evaluation of hypothyroidism and primary amenorrhea.   A. At age 68 months the child was noted to be somewhat slow to walk. She was referred to a pediatric neurologist, Dr. Wyline Copas, for further evaluation. Dr. Gaynell Face noted some minor gross motor and fine motor delays, but also noted a significant cardiac murmur. 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 After several years of follow-up, the patient underwent corrective surgery in 2009. She has done well clinically since that time.  B. 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 The geneticist noted the presence of a scoliosis and some limb length discrepancy. Tom had flattening of the left occipital region as compared to the right. Her scalp hair was thin, especially sparse in the temporal regions. The forehead was flat. She had short palpebral fissures and a wide nasal bridge. Epicanthal folds were present. She had a thin upper lip and some underbite. She also had facial asymmetry with the right cheek being more prominent than the left. The lower lip was also more prominent on the right than on the left. The neck was short and wide. The nipples were felt to be widely spaced. Labia majora  were slightly hypoplastic. She had mild soft tissue syndactyly between the third and fourth digits bilaterally. The right leg was longer than the left.  The right thigh was somewhat longer than the left thigh. The right foot was longer than the left foot. All of the right toes were larger than the left toes. Left nails were smaller and dystrophic. She also had a thoracolumbar scoliosis with convexity to the right. She walked with a tilt of her pelvis to the left side. 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 Because the clinical findings suggested the possibility of a genetic disorder, a karyotype was obtained. She had a 2, XX karyotype. A FISH study was also performed. This study showed no evidence for the common form of DiGeorge Syndrome. The geneticist was not able to make a specific genetic diagnosis. Although the geneticist requested that the family return for a follow-up examination, it appears that did not happen.  C. On 09/03/10 the patient was evaluated at [place]. The reason for referral was primary amenorrhea at age 20. The patient's breasts were noted to be Tanner stage III. The vagina looked normal externally. The patient was unable to tolerate either a speculum exam or pelvic exam. Pregnancy test was negative. Laboratory data showed a TSH of 9.296, free T4 0.91, and T3 of 126.5. Her TSH was definitely elevated. The free T4 and T3 were in the lower portion of the normal range. FSH was very elevated at  92.5. Prolactin was 4.2. Repeat karyotype was 53, XX. A pelvic ultrasound was performed in the [facility] office. The uterus and ovaries were not clearly seen. Endometrial stripe was  not seen. It was commented that additional imaging may be necessary.  D. On 10/28/10 the patient was seen by our former physician assistant, who obtained additional history to include past issues with allergies, asthma, and  gastroesophageal reflux disease. At that point the patient was taking Singulair daily, Advair daily, omeprazole daily, and cetirizine (Zyrtec) as needed. On examination the patient weighed 148 pounds which was at the 80th percentile. Her height was at the 8th percentile. Her blood pressure was 124/80 and heart rate was 68. She did have a somewhat dysmorphic facies. A mildly enlarged, but diffusely enlarged, nontender goiter was noted. Laboratory data showed a TSH of 7.517, free T4 of 1.02, and free T3 of 3.4. FSH was 90 and LH was 35. Testosterone was 23.56. Estradiol was 17.2. She definitely had both primary hypogonadism and primary hypothyroidism. At that point she was started on Synthroid, 25 mcg per day.  2. Upon our physician assistant's departure at the end of March 2012, the patient was rescheduled to see me on 01/31/2011. She was taking her Synthroid 25, mcg per day at that time. Her goiter was approximately 20-25 grams in size. The thyroid was non-tender. She was still amenorrheic. Thyroid tests done on that day showed a TSH of 1.667, free T4  1.26, and free T3 3.5. These tests were mid-range normal. Her TPO antibody was elevated at 299, c/w Hashimoto's thyroiditis. She was trying to follow our  Eat Right Diet plan. She was also walking at least an hour per day. She had lost 16 pounds. At her visit on 08/30/11, I met with the patient and her father.The patient was then taking Synthroid, 37.5 mcg/day.  I learned that her mother was not attending the visits in our clinic on the building's third floor because the mother was very afraid of heights and elevators. I made arrangements to see the patient and parents in the first floor radiology conference room on 09/13/11. Dr. Janeal Holmes, MD, PhD, our staff geneticist, joined me on that visit and we provided education to the family about possible Turner's Syndrome mosaicism, hypothyroidism, and Hashimoto's diease.   3. On 03/15/12 Dr. Abelina Bachelor and I  contacted Dr. Freddy Jaksch, pediatric endocrinologist at [facility] and a nationally recognized expert on Turner's Syndrome. We presented Riddhi's case to Dr. Melina Fiddler. Although Dr. Melina Fiddler agreed that Ander Purpura likely had a genetic basis for her complex congenital heart disease and other issues, Dr. Melina Fiddler did not feel that Jaley had Turner's mosaicism, but Dr, Melina Fiddler could not identify any other recognized syndrome that would fit Ifeoluwa. When I asked Dr. Melina Fiddler her opinion as to whether or not we needed to surgically define the gonads before beginning hormone replacement therapy, Dr. Melina Fiddler stated that we probably did not need to do so, but she did understand my concern that if an ovotestis or ovotestes were present, it/they should be removed due to the risk of possible later development of gonadal cancer. Dr. Melina Fiddler suggested that when we were ready to start hormone replacement therapy we use Vivelle-Dot transdermal estradiol, beginning with a dose of 25 mcg/day and gradually increasing the dose to 100 mcg/day over 1-2 years time. We should then wait for spontaneous occurrence of menses.  We could then add a progestin, such as Prometrium, on days 1-10 of each month.     4. In the succeeding five years Topanga's Hashimoto's disease T lymphocytes have gradually but progressively destroyed more thyrocytes. In response, we have gradually increased the patient's Synthroid dosage to 100 mcg/day  in order to keep her euthyroid. We have also been gradually increasing the strength of her Vivelle-Dot patches.  5. The patient's last [facility] clinic visit was on 12/19/16. In the interim she has been healthy.   AAnder Purpura has not noticed any changes in breast size or breast tenderness. She has been having vaginal bleeding 1-2 times per week. Bleeding has been light most of the time.     C. She remains on the mini-Vivelle 0.100 patch every 3-4 days; Synthroid 100 mcg/day for 6 days each week, but only 1/2 tablet  on Sundays; omeprazole, 20 mg, twice daily; and Citracal-D twice daily. She also takes Biotech form of vitamin D, 50,000 IU per week.   D. She is eating about the same or less and is walking about one hour per day.   6. Pertinent Review of Systems: Constitutional: The patient feels "good". She is healthy and has no significant complaints. Eyes: Vision is good. There are no significant eye complaints. Neck: The patient has no complaints of anterior neck swelling, soreness, tenderness,  pressure, discomfort, or difficulty swallowing.  Heart: The patient has no complaints of palpitations, irregular heat beats, chest pain, or chest pressure. She saw her [facility] pediatric cardiologist, Dr Ermalene Searing, in mid-October 2014. He gave hear a clean bill of health. Her next scheduled FU will be in 2019. Gastrointestinal: Bowel movents seem normal. The patient has no complaints of excessive hunger, acid reflux, upset stomach, stomach aches or pains, diarrhea, or constipation. Legs: Muscle mass and strength seem normal. There are no complaints of numbness, tingling, burning, or pain. No edema is noted. Feet: There are no obvious foot problems. There are no complaints of numbness, tingling, burning, or pain. No edema is noted. Hair: She says that her scalp hair seems to be fine. She feels that she is producing and losing about equal amounts of scalp hair.     PAST MEDICAL, FAMILY, AND SOCIAL HISTORY:  1. School and Family: She is not in school anymore. She remains unemployed. Dad and both paternal grandparents had thin hair.  2. Activities: She walks 60 minutes daily when the weather is good.   3. Tobacco, alcohol, and illicit drugs: None 4. PCP: Dr. Saddie Benders 5. GYN: Dr. Gwynneth Munson Dillard 6. Allergy: She has not had many symptoms since her last visit.  7. Genetics: Dr. Janeal Holmes  REVIEW OF SYSTEMS: There are no other significant problems involving her other body systems.  PHYSICAL EXAM: BP 130/74    Pulse 88   Wt 167 lb 12.8 oz (76.1 kg)   BMI 31.08 kg/m  Constitutional: The patient looks good today. She is letting her hair grow out more now. She was again interested in our discussions and participated appropriately, albeit fairly passively as before. Her insight appears to be fairly normal. She has lost 4 pounds since last visit.  Face: Her facies remain asymmetric/dysmorphic. The mouth is somewhat twisted rotationally, as before.  Eyes: There is no arcus or proptosis.  Mouth: The oropharynx appears normal. The tongue appears normal. There is normal oral moisture. There is no obvious gingivitis. Neck: There are no bruits present. The thyroid gland is low-lying.The thyroid gland is again about 22 grams in size. The right lobe has shrunk back to normal size. The left lobe is again enlarged. The consistency of the thyroid gland is normal. There is no thyroid tenderness to palpation. Lungs: The lungs are clear. Air movement is good. Heart: The heart rhythm and rate appear normal. Heart  sounds S1 and S2 are normal. I do not appreciate any pathologic heart murmurs.  Abdomen: The abdomen is still enlarged today. Bowel sounds are normal. The abdomen is soft and non-tender. There is no obviously palpable hepatomegaly, splenomegaly, or other masses.  Arms: Muscle mass appears appropriate for age.  Hands: There is no obvious tremor. Phalangeal and metacarpophalangeal joints appear normal. Palms are normal. Legs: Muscle mass appears appropriate for age. There is no edema.  Neurologic: Muscle strength is normal for age and gender  in both the upper and the lower extremities. Muscle tone appears normal. Sensation to touch is normal in the legs. Hair: Her hair at the vertex is thicker than it used to be, c/w estrogen effect. After cutting her hair, however, it appears that her hair at the vertex is still relatively thin. There are no truly thin spots or bare spots.   Laboratory data:     Labs 04/25/17:  HbA1c 5.5%, CBG 92  Labs 04/12/17: TSH 0.65, free T4 1.8, free T3 3.3; LH 4.4, FSH 4.4, estradiol 76  Labs 12/19/16: HbA1c 5.3%, CBG 95  Labs 12/06/16: LH 4.0, FSH 5.5, estradiol 135, testosterone 19, free testosterone 2.3; TSH  0.29, free T4 1.7, free T3 3.1; PTH 38, calcium 9.1, vitamin D 53  Labs 08/18/16: HbA1c 5.7%  Labs 08/08/16: TSH 0.78, free T4 1.6, free T3 3.1; LH 3.1, FSH 5.8, estradiol 33, testosterone 16; PTH 25, calcium 9.0, 25-OH vitamin D 56  Labs 05/18/16: HbA1c 5.6%  Labs 04/25/16: TSH 2.20, free T4 1.6, free T3 2.8; PTH 30, calcium 9.1, 25-OH vitamin D 67;  estradiol 92  Labs 01/04/16: HbA1c 5.5%  Labs 12/28/15: TSH 0.83, free T4 1.7, free T3 3.0; PTH 54, calcium 9.2, 25-OH vitamin D 21; CMP normal; LH 2.0, FSH 6.3, estradiol 119,  testosterone  43, free testosterone 8.5  08/17/15: HbA1c 5.6%  Labs 08/10/15: TSH 0.942, free T4 1.49, free T3 2.7; LH 2.3, FSH 13.1, testosterone 69, estradiol 71.0; PTH 44, calcium 9.3, 25-OH vitamin D 19  Labs 04/02/15: HbA1c was 6.0%. PTH 36, calcium 9.5, 25-OH vitamin D 20; LH 6.6, FSH 13, estradiol 84.8, testosterone 37; TSH 2.152, free T4 1.31, free T3 2.9  Labs 11/24/14: HbA1c was 5.7% today, compared with 6.1% at last visit and with 5.8% at the visit prior; calcium 9.2, PTH 49; 25-OH vitamin D 22; TSH 0.449, free T4 1.54, free T3 3.5; testosterone 41, estradiol 34.4  Labs 07/21/14: CMP normal; estradiol 30.2, FSH 64.1, LH 30.1, testosterone 41; TSH 3.596, free T4 1.75, free T3 3.3; PTH 42, calcium 9.4, 215-hydroxy vitamin D 33  Labs 12/24/13: TSH 0.768, free T4 1.42, free T3 3.0; estradiol 52.3, FSH 26.6, LH 10.3, testosterone 46; CMP normal   Labs 08/07/13: BMP normal, with calcium 9.5; estradiol 22.7; FSH 46.7, LH 18.3; TSH 1.787, free T4 1.63, free T3 3.1; testosterone 43   Labs 05/03/13: Estradiol 17.5, testosterone 66, free testosterone 14.9, LH 44.8, FSH 91.4, 25-hydroxy vitamin D 32, 1,25-dihydroxy vitamin D 55  Labs 01/18/13: BMP  normal, to include calcium of 9.9; TSH 1.156, free T4 1.51, free T3 3.1; testosterone 46 (increased from 33.05), estradiol 16.2 (increased from < 11.8); LH 34.4 (decreased from 36.5), FSH 86.3 (decreased from 106.1); PTH 36.8, phosphorus 4.0, 25-hydroxy vitamin D 27   Labs 10/12/12: TSH 2.079, free T4 1.44, free T3 2.9, LH 36.5,  FSH 106.2, estradiol <11.8, Testosterone 29.94, calcium 9.8, phosphorus 4.5, 25-vitamin D 29,   Labs 05/09/12: TSH 0.351, free T4  1.73, free T3 3.1   Labs 03/05/12: TSH 3.236, free T4 1.60, free T3 3.1   Labs 12/14/11: TSH 3.8, free T4 1.39, free T3 3.3, TPO 381, LH 33.4, FSH 86.9, testosterone 33.5, free testosterone 7.2 (normal 1.0-5.0)  Estradiol 13.6,                Labs 08/22/11: TSH 3.183, free T4 1.40, free T3 3.4. testosterone 34.45, estradiol 14.8     Buccal smear - normal  IMAGING:   MRI of pelvis 10/05/15: Uterus has divergent horns, but normal outer fundal contour. Associated fundal thickening is c/w septate uterus. Right ovary measures 11 x 8 mm and has a small simple cyst/follicle. Left ovary measures 11 x 8 mm aid is poorly evaluated but grossly unremarkable.   US pelvis 08/27/15: Uterus measured 9.1 x 3.2 x 4.8 cm. Endometrial thickness was 14 mm. Right ovary was not well visualized. Left ovary measured 2.2 x 2.0 x 2.1 cm. The left ovary appeared normal. There was a non-specific hypoechoic area within the right adnexal region that might be secondary to overlying bowel structures.  BMD 05/29/13: BMD in spine was > 50%. BMD in the hip was above the 95%. Z-scores were 0.3 and 1.68 respectively.   ASSESSMENT: 1. Hypothyroidism: The patient is euthyroid this month at about the 80% of the true normal thyroid hormone range on her current Synthroid dose.  2. Hashimoto's thyroiditis: Her Hashimoto's disease is clinically quiescent, but intermittently active. The pattern of all three TFTs shifting in parallel together in the same direction, upward or downward, that  occurred between October 2014 and March 2015, is pathognomonic for having had an interim flare up of Hashimoto's thyroiditis.  3. Goiter: The thyroid gland is the same size today. The waxing and waning of thyroid gland size is also c/w evolving Hashimoto's disease.  4. Primary Amenorrhea/Congenital small (hypoplastic) uterus:   A. When she became euthyroid, she had increases in [facility] and [place] into the menopausal range. Since initiating estradiol therapy, however, her LH and FSH had decreased and her testosterone and estradiol had increased. Some of these increases may have been due to increased ovarian production of these hormones. Some of the increased testosterone may have been caused by the effects of overly fat adipose cell cytokines causing insulin resistance and hyperinsulinemia, the hyperinsulinemia in turn causing elevated testosterone. Some of the increased estradiol may have been due to increased aromatization of testosterone by adipose cells.   B. When we began estrogen replacement therapy, our immediate estrogen replacement goals were three-fold:   1. To promote estrogenization of breasts and vaginal tissues   2. To promote feminization of her brain and body overall   3. To improve bone mineral density   C. Tumeka has responded well to [place]. She is now having vaginal bleeding 1-2 times per week. We will continue her current dose of mini-Vivelle again today. I will try again to contact Dr. Charlesetta Garibaldi to see if she would like to see Farzana and would like me to order progesterone. We will reassess her clinical exam and lab tests every 3-4 months.  5. Obesity: Patient's weight has decreased again. She has been exercising more and probably also eating less. She needs to continue to burn off more calories than she takes in.   6. Developmental delays: As noted in my November 2012 note, the patient had an IQ test which gave her an IQ of 11. In many prior visits her interactions were c/w that IQ or  perhaps a somewhat lower IQ.  At her last several visits and again at today's visit she was more engaged, albeit still relatively passive, and acted quite normally.  7. GERD: Her GERD and dyspepsia are well-controlled with omeprazole. At this point she shows no signs of tachyphylaxis.  8. Kyphosis: Her repeat scoliosis series did not show progression. In fact, there was a suggestion of improvement. 9. Hypertension: Her. BP is acceptable today. She needs to continue to eat right, increase daily exercise, and lose more fat weight.  10. Pre-diabetes: Her HbA1c has increased slightly, but is still within the normal range. As noted above, she needs to eat right, exercise for an hour a day, and lose fat weight.   11. Vitamin D deficiency disease: Her vitamin D level in October 2017 had increased nicely since starting Biotech, 50,000 IU per week. Her vitamin D level in February 2018 was a bit lower, but still good. I would like to see her calcium levels more into the 9.5-10.0 range.   PLAN: 1. Diagnostic: Repeat the testosterone, estradiol, LH, and FSH, plus 25-hydroxy vitamin D, calcium, and PTH in 4 months.  2. Therapeutic: Continue the mini-Vivelle at 0.100 mg patch every 3-4 days.  Continue omeprazole, 20 mg, twice daily. Take one Citracal-D, twice daily, at lunch and dinner.  Continue Biotech one 50,00 IU capsule once a week. Add 1-2 Tums at bedtime. Continue the Synthroid dose of 100 mcg/day for 6 days each week, but take only 1/2 pill on Sundays. 3. Patient education: We again discussed the fact that we started ERT in order to promote feminization and to achieve as much bone mineralization as possible. We still do not know if the uterus will grow enough for her to be a candidate for in vitro fertilization. We again discussed the possible need for progestin therapy. I told her that I will attempt to contact Dr. Charlesetta Garibaldi again so that we can discuss therapeutic options for Bricelyn.  4. Follow-up: Follow-up  visit in 4 months.  Level of Service: This visit lasted in excess of 55 minutes. More than 50% of the visit was devoted to counseling.  Tillman Sers, MD, CDE Adult and Pediatric Endocrinology

## 2017-05-27 ENCOUNTER — Other Ambulatory Visit (INDEPENDENT_AMBULATORY_CARE_PROVIDER_SITE_OTHER): Payer: Self-pay | Admitting: "Endocrinology

## 2017-05-27 DIAGNOSIS — R1013 Epigastric pain: Secondary | ICD-10-CM

## 2017-06-08 ENCOUNTER — Encounter (INDEPENDENT_AMBULATORY_CARE_PROVIDER_SITE_OTHER): Payer: Self-pay | Admitting: "Endocrinology

## 2017-08-02 ENCOUNTER — Other Ambulatory Visit (INDEPENDENT_AMBULATORY_CARE_PROVIDER_SITE_OTHER): Payer: Self-pay | Admitting: "Endocrinology

## 2017-08-02 DIAGNOSIS — E063 Autoimmune thyroiditis: Secondary | ICD-10-CM

## 2017-08-20 LAB — COMPLETE METABOLIC PANEL WITH GFR
AG Ratio: 1.7 (calc) (ref 1.0–2.5)
ALT: 8 U/L (ref 6–29)
AST: 10 U/L (ref 10–30)
Albumin: 4.1 g/dL (ref 3.6–5.1)
Alkaline phosphatase (APISO): 69 U/L (ref 33–115)
BUN: 14 mg/dL (ref 7–25)
CO2: 23 mmol/L (ref 20–32)
Calcium: 9.2 mg/dL (ref 8.6–10.2)
Chloride: 107 mmol/L (ref 98–110)
Creat: 1 mg/dL (ref 0.50–1.10)
GFR, Est African American: 91 mL/min/{1.73_m2} (ref >=60)
GFR, Est Non African American: 79 mL/min/{1.73_m2} (ref >=60)
Globulin: 2.4 g/dL (calc) (ref 1.9–3.7)
Glucose, Bld: 90 mg/dL (ref 65–99)
Potassium: 4.8 mmol/L (ref 3.5–5.3)
Sodium: 140 mmol/L (ref 135–146)
Total Bilirubin: 0.4 mg/dL (ref 0.2–1.2)
Total Protein: 6.5 g/dL (ref 6.1–8.1)

## 2017-08-20 LAB — VITAMIN D 1,25 DIHYDROXY
Vitamin D 1, 25 (OH)2 Total: 53 pg/mL (ref 18–72)
Vitamin D2 1, 25 (OH)2: <8 pg/mL
Vitamin D3 1, 25 (OH)2: 53 pg/mL

## 2017-08-20 LAB — ESTRADIOL: Estradiol: 79 pg/mL

## 2017-08-20 LAB — FOLLICLE STIMULATING HORMONE: FSH: 4.2 m[IU]/mL

## 2017-08-20 LAB — T3, FREE: T3, Free: 3.3 pg/mL (ref 2.3–4.2)

## 2017-08-20 LAB — TESTOS,TOTAL,FREE AND SHBG (FEMALE)
Free Testosterone: 2.1 pg/mL (ref 0.1–6.4)
Sex Hormone Binding: 33 nmol/L (ref 17–124)
Testosterone, Total, LC-MS-MS: 15 ng/dL (ref 2–45)

## 2017-08-20 LAB — TSH: TSH: 0.22 mIU/L — ABNORMAL LOW

## 2017-08-20 LAB — T4, FREE: Free T4: 1.6 ng/dL (ref 0.8–1.8)

## 2017-08-20 LAB — LUTEINIZING HORMONE: LH: 0.5 m[IU]/mL

## 2017-08-22 ENCOUNTER — Other Ambulatory Visit: Payer: Self-pay | Admitting: Obstetrics and Gynecology

## 2017-08-28 ENCOUNTER — Ambulatory Visit (INDEPENDENT_AMBULATORY_CARE_PROVIDER_SITE_OTHER): Payer: BLUE CROSS/BLUE SHIELD | Admitting: "Endocrinology

## 2017-09-14 ENCOUNTER — Other Ambulatory Visit: Payer: Self-pay | Admitting: "Endocrinology

## 2017-09-14 DIAGNOSIS — N912 Amenorrhea, unspecified: Secondary | ICD-10-CM

## 2017-09-26 ENCOUNTER — Inpatient Hospital Stay (HOSPITAL_COMMUNITY): Admission: RE | Admit: 2017-09-26 | Payer: BLUE CROSS/BLUE SHIELD | Source: Ambulatory Visit

## 2017-10-13 ENCOUNTER — Other Ambulatory Visit: Payer: Self-pay | Admitting: Obstetrics and Gynecology

## 2017-10-30 NOTE — Patient Instructions (Addendum)
Your procedure is scheduled on:  Friday, Jan 25  [facility] at:  7:15 am  Pick up the phone at the desk and dial [phone].  Call this number if you have problems the morning of surgery: [phone].  Remember: Do NOT eat or Do NOT drink clear liquids (including water) after midnight Thursday  Take these medicines the morning of surgery with a SIP OF WATER: levothyroxine and omeprazole  Bring albuterol inhaler with you on day of surgery.  Do NOT wear jewelry (body piercing), metal hair clips/bobby pins, make-up, or nail polish. Do NOT wear lotions, powders, or perfumes.  You may wear deoderant. Do NOT shave for 48 hours prior to surgery. Do NOT bring valuables to the hospital.  Have a responsible adult drive you home and stay with you for 24 hours after your procedure.  Home with Father "Gala RomneyDoug" cell [phone]

## 2017-11-03 ENCOUNTER — Encounter (HOSPITAL_COMMUNITY): Payer: Self-pay

## 2017-11-03 ENCOUNTER — Other Ambulatory Visit: Payer: Self-pay

## 2017-11-03 ENCOUNTER — Encounter (HOSPITAL_COMMUNITY)
Admission: RE | Admit: 2017-11-03 | Discharge: 2017-11-03 | Disposition: A | Discharge status: Home / Self Care | Payer: BLUE CROSS/BLUE SHIELD | Source: Ambulatory Visit | Attending: Obstetrics and Gynecology | Admitting: Obstetrics and Gynecology

## 2017-11-03 DIAGNOSIS — Z01818 Encounter for other preprocedural examination: Secondary | ICD-10-CM | POA: Diagnosis not present

## 2017-11-03 DIAGNOSIS — N912 Amenorrhea, unspecified: Secondary | ICD-10-CM | POA: Diagnosis not present

## 2017-11-03 HISTORY — DX: Other seasonal allergic rhinitis: J30.2

## 2017-11-03 HISTORY — DX: Anemia, unspecified: D64.9

## 2017-11-03 LAB — CBC
HCT: 31.4 % — ABNORMAL LOW (ref 36.0–46.0)
Hemoglobin: 9.8 g/dL — ABNORMAL LOW (ref 12.0–15.0)
MCH: 21.5 pg — ABNORMAL LOW (ref 26.0–34.0)
MCHC: 31.2 g/dL (ref 30.0–36.0)
MCV: 68.9 fL — ABNORMAL LOW (ref 78.0–100.0)
Platelets: 407 10*3/uL — ABNORMAL HIGH (ref 150–400)
RBC: 4.56 MIL/uL (ref 3.87–5.11)
RDW: 16.4 % — ABNORMAL HIGH (ref 11.5–15.5)
WBC: 9.3 10*3/uL (ref 4.0–10.5)

## 2017-11-04 ENCOUNTER — Other Ambulatory Visit: Payer: Self-pay | Admitting: Allergy

## 2017-11-04 DIAGNOSIS — J4530 Mild persistent asthma, uncomplicated: Secondary | ICD-10-CM

## 2017-11-06 ENCOUNTER — Other Ambulatory Visit: Payer: Self-pay | Admitting: Allergy

## 2017-11-06 DIAGNOSIS — J4530 Mild persistent asthma, uncomplicated: Secondary | ICD-10-CM

## 2017-11-07 ENCOUNTER — Other Ambulatory Visit: Payer: Self-pay | Admitting: Allergy

## 2017-11-07 DIAGNOSIS — J4530 Mild persistent asthma, uncomplicated: Secondary | ICD-10-CM

## 2017-11-08 ENCOUNTER — Other Ambulatory Visit: Payer: Self-pay | Admitting: Allergy

## 2017-11-08 DIAGNOSIS — J4530 Mild persistent asthma, uncomplicated: Secondary | ICD-10-CM

## 2017-11-09 ENCOUNTER — Encounter (INDEPENDENT_AMBULATORY_CARE_PROVIDER_SITE_OTHER): Payer: Self-pay | Admitting: "Endocrinology

## 2017-11-09 ENCOUNTER — Ambulatory Visit (INDEPENDENT_AMBULATORY_CARE_PROVIDER_SITE_OTHER): Payer: BLUE CROSS/BLUE SHIELD | Admitting: "Endocrinology

## 2017-11-09 VITALS — BP 120/80 | HR 80 | Ht 61.42 in | Wt 166.4 lb

## 2017-11-09 DIAGNOSIS — E049 Nontoxic goiter, unspecified: Secondary | ICD-10-CM

## 2017-11-09 DIAGNOSIS — R7303 Prediabetes: Secondary | ICD-10-CM

## 2017-11-09 DIAGNOSIS — I10 Essential (primary) hypertension: Secondary | ICD-10-CM

## 2017-11-09 DIAGNOSIS — E559 Vitamin D deficiency, unspecified: Secondary | ICD-10-CM

## 2017-11-09 DIAGNOSIS — E063 Autoimmune thyroiditis: Secondary | ICD-10-CM

## 2017-11-09 DIAGNOSIS — K219 Gastro-esophageal reflux disease without esophagitis: Secondary | ICD-10-CM | POA: Diagnosis not present

## 2017-11-09 DIAGNOSIS — J4530 Mild persistent asthma, uncomplicated: Secondary | ICD-10-CM | POA: Diagnosis not present

## 2017-11-09 DIAGNOSIS — N910 Primary amenorrhea: Secondary | ICD-10-CM

## 2017-11-09 LAB — POCT GLUCOSE (DEVICE FOR HOME USE): Glucose Fasting, POC: 96 mg/dL (ref 70–99)

## 2017-11-09 LAB — POCT GLYCOSYLATED HEMOGLOBIN (HGB A1C): Hemoglobin A1C: 5.5

## 2017-11-09 MED ORDER — MONTELUKAST SODIUM 10 MG PO TABS: 10.0000 mg | ORAL_TABLET | Freq: Every day | ORAL | 6 refills | Status: DC

## 2017-11-09 NOTE — Telephone Encounter (Signed)
Courtesy refill  

## 2017-11-09 NOTE — Progress Notes (Signed)
Chief complaint: Follow-up of primary amenorrhea, congenital hypoplastic uterus, acquired autoimmune hypothyroidism, Hashimoto's thyroiditis, goiter, kyphosis, scoliosis, osteopenia, s/p repair of double outlet right ventricle, and gastroesophageal reflux disease  History of present illness: The patient is a 27 year-old Caucasian young woman. She was unaccompanied.  1. We have followed this young woman since 10/28/2010 when she was referred by Dr. Crawford Givens, [facility] and Gynecology, for evaluation of hypothyroidism and primary amenorrhea.   A. At age 52 months the child was noted to be somewhat slow to walk. She was referred to a pediatric neurologist, Dr. Wyline Copas, for further evaluation. Dr. Gaynell Face noted some minor gross motor and fine motor delays, but also noted a significant cardiac murmur. 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 After several years of follow-up, the patient underwent corrective surgery in 2009. She has done well clinically since that time.  B. 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 The geneticist noted the presence of a scoliosis and some limb length discrepancy. Ernestene had flattening of the left occipital region as compared to the right. Her scalp hair was thin, especially sparse in the temporal regions. The forehead was flat. She had short palpebral fissures and a wide nasal bridge. Epicanthal folds were present. She had a thin upper lip and some underbite. She also had facial asymmetry with the right cheek being more prominent than the left. The lower lip was also more prominent on the right than on the left. The neck was short and wide. The nipples were felt to be widely spaced. Labia majora were slightly  hypoplastic. She had mild soft tissue syndactyly between the third and fourth digits bilaterally. The right leg was longer than the left.  The right thigh was somewhat longer than the left thigh. The right foot was longer than the left foot. All of the right toes were larger than the left toes. Left nails were smaller and dystrophic. She also had a thoracolumbar scoliosis with convexity to the right. She walked with a tilt of her pelvis to the left side. 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 Because the clinical findings suggested the possibility of a genetic disorder, a karyotype was obtained. She had a 26, XX karyotype. A FISH study was also performed. This study showed no evidence for the common form of DiGeorge Syndrome. The geneticist was not able to make a specific genetic diagnosis. Although the geneticist requested that the family return for a follow-up examination, it appears that did not happen.  C. On 09/03/10 the patient was evaluated at [facility]. The reason for referral was primary amenorrhea at age 27. The patient's breasts were noted to be Tanner stage III. The vagina looked normal externally. The patient was unable to tolerate either a speculum exam or pelvic exam. Pregnancy test was negative. Laboratory data showed a TSH of 9.296, free T4 0.91, and T3 of 126.5. Her TSH was definitely elevated. The free T4 and T3 were in the lower portion of the normal range. FSH was very elevated at  92.5. Prolactin was 4.2. Repeat karyotype was 17, XX. A pelvic ultrasound was performed in the [facility] office. The uterus and ovaries were not clearly seen. Endometrial stripe was not seen. It  was commented that additional imaging may be necessary.  D. On 10/28/10 the patient was seen by our former physician assistant, who obtained additional history to include past issues with allergies, asthma, and gastroesophageal  reflux disease. At that point the patient was taking Singulair daily, Advair daily, omeprazole daily, and cetirizine (Zyrtec) as needed. On examination the patient weighed 148 pounds which was at the 80th percentile. Her height was at the 8th percentile. Her blood pressure was 124/80 and heart rate was 68. She did have a somewhat dysmorphic facies. A mildly enlarged, but diffusely enlarged, nontender goiter was noted. Laboratory data showed a TSH of 7.517, free T4 of 1.02, and free T3 of 3.4. FSH was 90 and LH was 35. Testosterone was 23.56. Estradiol was 17.2. She definitely had both primary hypogonadism and primary hypothyroidism. At that point she was started on Synthroid, 25 mcg per day.  2. Upon our physician assistant's departure at the end of March 2012, the patient was rescheduled to see me on 01/31/2011. She was taking her Synthroid 25, mcg per day at that time. Her goiter was approximately 20-25 grams in size. The thyroid was non-tender. She was still amenorrheic. Thyroid tests done on that day showed a TSH of 1.667, free T4  1.26, and free T3 3.5. These tests were mid-range normal. Her TPO antibody was elevated at 299, c/w Hashimoto's thyroiditis. She was trying to follow our  Eat Right Diet plan. She was also walking at least an hour per day. She had lost 16 pounds. At her visit on 08/30/11, I met with the patient and her father.The patient was then taking Synthroid, 37.5 mcg/day.  I learned that her mother was not attending the visits in our clinic on the building's third floor because the mother was very afraid of heights and elevators. I made arrangements to see the patient and parents in the first floor radiology conference room on 09/13/11. Dr. Janeal Holmes, MD, PhD, our staff geneticist, joined me on that visit and we provided education to the family about possible Turner's Syndrome mosaicism, hypothyroidism, and Hashimoto's diease.   3. On 03/15/12 Dr. Abelina Bachelor and I contacted Dr. Freddy Jaksch, pediatric endocrinologist at [facility] and a nationally recognized expert on Turner's Syndrome. We presented Noorah's case to Dr. Melina Fiddler. Although Dr. Melina Fiddler agreed that Ander Purpura likely had a genetic basis for her complex congenital heart disease and other issues, Dr. Melina Fiddler did not feel that Mega had Turner's mosaicism, but Dr, Melina Fiddler could not identify any other recognized syndrome that would fit Amala. When I asked Dr. Melina Fiddler her opinion as to whether or not we needed to surgically define the gonads before beginning hormone replacement therapy, Dr. Melina Fiddler stated that we probably did not need to do so, but she did understand my concern that if an ovotestis or ovotestes were present, it/they should be removed due to the risk of possible later development of gonadal cancer. Dr. Melina Fiddler suggested that when we were ready to start hormone replacement therapy we use Vivelle-Dot transdermal estradiol, beginning with a dose of 25 mcg/day and gradually increasing the dose to 100 mcg/day over 1-2 years time. We should then wait for spontaneous occurrence of menses.  We could then add a progestin, such as Prometrium, on days 1-10 of each month.     4. In the succeeding five years Steffani's Hashimoto's disease T lymphocytes have gradually but progressively destroyed more thyrocytes. In response, we have gradually increased the patient's Synthroid dosage to 100 mcg/day on 6-7 days  per week in order to keep her euthyroid. We have also been gradually increasing the strength of her Vivelle-Dot patches.  5. The patient's last [facility] clinic visit was on 04/25/17. In the interim she has been healthy.   AAnder Purpura has not noticed any changes in breast size or breast tenderness.   B. She saw Dr. Charlesetta Garibaldi on 06/26/17 and on 07/11/17. Dr. Charlesetta Garibaldi added Provera for 14 days each month. Now Meesha has regular periods following each cycle of Provera. Natacia is scheduled for a D&C and hysteroscopy tomorrow.  C.  She remains on the mini-Vivelle 0.100 patch every 3-4 days; Synthroid 100 mcg/day for 6 days each week, but only 1/2 tablet on Sundays; omeprazole, 20 mg, twice daily; and Citracal-D twice daily. She also takes Biotech form of vitamin D, 50,000 IU per week.   D. She is eating about the same or less. She has not been walking much in the cold weather.    6. Pertinent Review of Systems: Constitutional: The patient feels "good". She is healthy and has no significant complaints. Eyes: Vision is good. There are no significant eye complaints. Neck: The patient has no complaints of anterior neck swelling, soreness, tenderness,  pressure, discomfort, or difficulty swallowing.  Heart: The patient has no complaints of palpitations, irregular heat beats, chest pain, or chest pressure. She saw her [facility] pediatric cardiologist, Dr Ermalene Searing, in mid-October 2014. He gave hear a clean bill of health. Her next scheduled FU will be in 2019. Gastrointestinal: Bowel movents seem normal. The patient has no complaints of excessive hunger, acid reflux, upset stomach, stomach aches or pains, diarrhea, or constipation. Legs: Muscle mass and strength seem normal. There are no complaints of numbness, tingling, burning, or pain. No edema is noted. Feet: There are no obvious foot problems. There are no complaints of numbness, tingling, burning, or pain. No edema is noted. Hair: She says that her scalp hair seems to be fine. She feels that she is producing and losing about equal amounts of scalp hair.     PAST MEDICAL, FAMILY, AND SOCIAL HISTORY:  1. School and Family: She remains unemployed. Dad and both paternal grandparents had thin hair.  2. Activities: She was walking 60 minutes daily when the weather is good, but not recently.   3. Tobacco, alcohol, and illicit drugs: None 4. PCP: Dr. Saddie Benders 5. GYN: Dr. Gwynneth Munson Dillard 6. Allergy: She has not had many symptoms since her last visit.  7. Genetics: Dr. Janeal Holmes  REVIEW OF SYSTEMS: There are no other significant problems involving her other body systems.  PHYSICAL EXAM: BP 120/80   Pulse 80   Ht 5' 1.42" (1.56 m)   Wt 166 lb 6.4 oz (75.5 kg)   LMP 11/06/2017 Comment: amenorrhea  BMI 31.02 kg/m   Constitutional: The patient looks good today. She is letting her hair grow out more now. She was again interested in our discussions and participated appropriately, more actively than before. Her insight appears to be fairly normal. She has lost about 1.5 pounds since her last visit.  Face: Her facies remain asymmetric/dysmorphic. The mouth is somewhat twisted rotationally, as before.  Eyes: There is no arcus or proptosis.  Mouth: The oropharynx appears normal. The tongue appears normal. There is normal oral moisture. There is no obvious gingivitis. Neck: There are no bruits present. The thyroid gland is low-lying.The thyroid gland is smaller at about 21 grams in size. The right lobe has shrunk back to normal size. The left  lobe is still enlarged, but smaller. The consistency of the thyroid gland is normal. There is no thyroid tenderness to palpation. Lungs: The lungs are clear. Air movement is good. Heart: The heart rhythm and rate appear normal. Heart sounds S1 and S2 are normal. I do not appreciate any pathologic heart murmurs.  Abdomen: The abdomen is still enlarged today. Bowel sounds are normal. The abdomen is soft and non-tender. There is no obviously palpable hepatomegaly, splenomegaly, or other masses.  Arms: Muscle mass appears appropriate for age.  Hands: There is no obvious tremor. Phalangeal and metacarpophalangeal joints appear normal. Palms are normal. Legs: Muscle mass appears appropriate for age. There is no edema.  Neurologic: Muscle strength is normal for age and gender  in both the upper and the lower extremities. Muscle tone appears normal. Sensation to touch is normal in the legs. Hair: Her hair at the vertex is thicker than it  used to be, c/w estrogen effect. After cutting her hair, however, it appears that her hair at the vertex is still relatively thin. There are no truly thin spots or bare spots.   Laboratory data:     Labs 11/09/17: HbA1c 5.5%, CBG 96  Labs 11/03/17: CBC Hgb 9.8, Hct 31.4, MCV 68.9, MCH 21.5  Labs 08/15/17: TSH 0.22, free T4 2.1, free T3 3.3; 1, 25-OH vitamin D 53 ; LH 0.5, FSH 4.2, estradiol 79, testosterone 15; CMP normal  Labs 04/25/17: HbA1c 5.5%, CBG 92  Labs 04/12/17: TSH 0.65, free T4 1.8, free T3 3.3; LH 4.4, FSH 4.4, estradiol 76  Labs 12/19/16: HbA1c 5.3%, CBG 95  Labs 12/06/16: LH 4.0, FSH 5.5, estradiol 135, testosterone 19, free testosterone 2.3; TSH  0.29, free T4 1.7, free T3 3.1; PTH 38, calcium 9.1, vitamin D 53  Labs 08/18/16: HbA1c 5.7%  Labs 08/08/16: TSH 0.78, free T4 1.6, free T3 3.1; LH 3.1, FSH 5.8, estradiol 33, testosterone 16; PTH 25, calcium 9.0, 25-OH vitamin D 56  Labs 05/18/16: HbA1c 5.6%  Labs 04/25/16: TSH 2.20, free T4 1.6, free T3 2.8; PTH 30, calcium 9.1, 25-OH vitamin D 67;  estradiol 92  Labs 01/04/16: HbA1c 5.5%  Labs 12/28/15: TSH 0.83, free T4 1.7, free T3 3.0; PTH 54, calcium 9.2, 25-OH vitamin D 21; CMP normal; LH 2.0, FSH 6.3, estradiol 119,  testosterone  43, free testosterone 8.5  08/17/15: HbA1c 5.6%  Labs 08/10/15: TSH 0.942, free T4 1.49, free T3 2.7; LH 2.3, FSH 13.1, testosterone 69, estradiol 71.0; PTH 44, calcium 9.3, 25-OH vitamin D 19  Labs 04/02/15: HbA1c was 6.0%. PTH 36, calcium 9.5, 25-OH vitamin D 20; LH 6.6, FSH 13, estradiol 84.8, testosterone 37; TSH 2.152, free T4 1.31, free T3 2.9  Labs 11/24/14: HbA1c was 5.7% today, compared with 6.1% at last visit and with 5.8% at the visit prior; calcium 9.2, PTH 49; 25-OH vitamin D 22; TSH 0.449, free T4 1.54, free T3 3.5; testosterone 41, estradiol 34.4  Labs 07/21/14: CMP normal; estradiol 30.2, FSH 64.1, LH 30.1, testosterone 41; TSH 3.596, free T4 1.75, free T3 3.3; PTH 42, calcium 9.4,  215-hydroxy vitamin D 33  Labs 12/24/13: TSH 0.768, free T4 1.42, free T3 3.0; estradiol 52.3, FSH 26.6, LH 10.3, testosterone 46; CMP normal   Labs 08/07/13: BMP normal, with calcium 9.5; estradiol 22.7; FSH 46.7, LH 18.3; TSH 1.787, free T4 1.63, free T3 3.1; testosterone 43   Labs 05/03/13: Estradiol 17.5, testosterone 66, free testosterone 14.9, LH 44.8, FSH 91.4, 25-hydroxy vitamin D 32, 1,25-dihydroxy vitamin  D 55  Labs 01/18/13: BMP normal, to include calcium of 9.9; TSH 1.156, free T4 1.51, free T3 3.1; testosterone 46 (increased from 33.05), estradiol 16.2 (increased from < 11.8); LH 34.4 (decreased from 36.5), FSH 86.3 (decreased from 106.1); PTH 36.8, phosphorus 4.0, 25-hydroxy vitamin D 27   Labs 10/12/12: TSH 2.079, free T4 1.44, free T3 2.9, LH 36.5,  FSH 106.2, estradiol <11.8, Testosterone 29.94, calcium 9.8, phosphorus 4.5, 25-vitamin D 29,   Labs 05/09/12: TSH 0.351, free T4 1.73, free T3 3.1   Labs 03/05/12: TSH 3.236, free T4 1.60, free T3 3.1   Labs 12/14/11: TSH 3.8, free T4 1.39, free T3 3.3, TPO 381, LH 33.4, FSH 86.9, testosterone 33.5, free testosterone 7.2 (normal 1.0-5.0)  Estradiol 13.6,                Labs 08/22/11: TSH 3.183, free T4 1.40, free T3 3.4. testosterone 34.45, estradiol 14.8     Buccal smear - normal  IMAGING:   MRI of pelvis 10/05/15: Uterus has divergent horns, but normal outer fundal contour. Associated fundal thickening is c/w septate uterus. Right ovary measures 11 x 8 mm and has a small simple cyst/follicle. Left ovary measures 11 x 8 mm aid is poorly evaluated but grossly unremarkable.   US pelvis 08/27/15: Uterus measured 9.1 x 3.2 x 4.8 cm. Endometrial thickness was 14 mm. Right ovary was not well visualized. Left ovary measured 2.2 x 2.0 x 2.1 cm. The left ovary appeared normal. There was a non-specific hypoechoic area within the right adnexal region that might be secondary to overlying bowel structures.  BMD 05/29/13: BMD in spine was > 50%.  BMD in the hip was above the 95%. Z-scores were 0.3 and 1.68 respectively.   ASSESSMENT: 1. Hypothyroidism: The patient was mildly hyperthyroid in late October. We need to reduce her Synthroid dose a bit now.   2. Hashimoto's thyroiditis: Her Hashimoto's disease is clinically quiescent, but intermittently active. The pattern of all three TFTs shifting in parallel together in the same direction, upward or downward, that occurred between October 2014 and March 2015, is pathognomonic for having had an interim flare up of Hashimoto's thyroiditis.  3. Goiter: The thyroid gland is smaller today. The waxing and waning of thyroid gland size is also c/w evolving Hashimoto's disease.  4. Primary Amenorrhea/Congenital small (hypoplastic) uterus:   A. When she became euthyroid, she had increases in [facility] and [place] into the menopausal range. Since initiating estradiol therapy, however, her LH and FSH had decreased and her testosterone and estradiol had increased. Some of these increases may have been due to increased ovarian production of these hormones. Some of the increased testosterone may have been caused by the effects of overly fat adipose cell cytokines causing insulin resistance and hyperinsulinemia, the hyperinsulinemia in turn causing elevated testosterone. Some of the increased estradiol may have been due to increased aromatization of testosterone by adipose cells.   B. When we began estrogen replacement therapy, our immediate estrogen replacement goals were three-fold:   1. To promote estrogenization of breasts and vaginal tissues   2. To promote feminization of her brain and body overall   3. To improve bone mineral density   C. Wendolyn has responded well to [place]. When she saw Dr. Charlesetta Garibaldi, Dr. Charlesetta Garibaldi started Provera for 14 days each month, resulting in monthly periods for Graceanna. Dr. Charlesetta Garibaldi will perform a hysteroscopy tomorrow. If Dr. Charlesetta Garibaldi assumes all of Amiri's GYN care that will be perfectly  fine with me.  5. Obesity: Patient's weight has decreased again. She has been less physically recently, so she is at risk of re-gaining weight. She needs to continue to burn off more calories than she takes in.   6. Developmental delays: As noted in my November 2012 note, the patient had an IQ test which gave her an IQ of 56. In many prior visits her interactions were c/w that IQ or perhaps a somewhat lower IQ.  At her last several visits and again at today's visit she was more engaged and acted quite normally.  7. GERD: Her GERD and dyspepsia are well-controlled with omeprazole. At this point she shows no signs of tachyphylaxis.  8. Kyphosis: Her repeat scoliosis series did not show progression. In fact, there was a suggestion of improvement. 9. Hypertension: Her diastolic BP is higher today. She needs to continue to eat right, increase daily exercise, and lose more fat weight.  10. Pre-diabetes: Her HbA1c is stable at the upper end of the normal range 11. Vitamin D deficiency disease: Her vitamin D level in October 2017 had increased nicely since starting Biotech, 50,000 IU per week. Her vitamin D level in February 2018 was a bit lower, but still good. Her recent calcitriol level was good. I would like to see her calcium levels more into the 9.5-10.0 range.   PLAN: 1. Diagnostic: Repeat TFTs 25-OH vitamin D, calcium, and PTH in two months. 2. Therapeutic: Continue the mini-Vivelle at 0.100 mg patch every 3-4 days or change as directed by Dr. Charlesetta Garibaldi.  Continue omeprazole, 20 mg, twice daily. Take one Citracal-D, twice daily, at lunch and dinner.  Continue Biotech one 50,00 IU capsule once a week. Add 1-2 Tums at bedtime. Continue the Synthroid dose of 100 mcg/day for 6 days each week, but skip the 1/2 pill on Sundays. 3. Patient education: We again discussed the fact that we started ERT in order to promote feminization and to achieve as much bone mineralization as possible. We still do not know if the  uterus will grow enough for her to be a candidate for in vitro fertilization. We again discussed the possible need for progestin therapy. We will see what Dr. Charlesetta Garibaldi finds tomorrow and see what Dr. Berneta Sages plans are for Irem's future GYN care.  4. Follow-up: Follow-up visit in 4 months.  Level of Service: This visit lasted in excess of 55 minutes. More than 50% of the visit was devoted to counseling.  Tillman Sers, MD, CDE Adult and Pediatric Endocrinology

## 2017-11-09 NOTE — Patient Instructions (Signed)
Follow up visit in 4 months. Please reduce Synthroid doses to 100 mcg/day for 6 days each week, but do not take any Synthroid on Sundays. Please repeat lab tests in about two months.

## 2017-11-10 ENCOUNTER — Encounter (HOSPITAL_COMMUNITY): Payer: Self-pay | Admitting: *Deleted

## 2017-11-10 ENCOUNTER — Ambulatory Visit (HOSPITAL_COMMUNITY): Payer: BLUE CROSS/BLUE SHIELD | Admitting: Anesthesiology

## 2017-11-10 ENCOUNTER — Encounter (HOSPITAL_COMMUNITY)
Admission: RE | Disposition: A | Discharge status: Home / Self Care | Payer: Self-pay | Source: Ambulatory Visit | Attending: Obstetrics and Gynecology

## 2017-11-10 ENCOUNTER — Ambulatory Visit (HOSPITAL_COMMUNITY)
Admission: RE | Admit: 2017-11-10 | Discharge: 2017-11-10 | Disposition: A | Discharge status: Home / Self Care | Payer: BLUE CROSS/BLUE SHIELD | Source: Ambulatory Visit | Attending: Obstetrics and Gynecology | Admitting: Obstetrics and Gynecology

## 2017-11-10 ENCOUNTER — Other Ambulatory Visit: Payer: Self-pay

## 2017-11-10 DIAGNOSIS — Z79899 Other long term (current) drug therapy: Secondary | ICD-10-CM | POA: Diagnosis not present

## 2017-11-10 DIAGNOSIS — N840 Polyp of corpus uteri: Secondary | ICD-10-CM | POA: Insufficient documentation

## 2017-11-10 DIAGNOSIS — R9389 Abnormal findings on diagnostic imaging of other specified body structures: Secondary | ICD-10-CM | POA: Diagnosis present

## 2017-11-10 DIAGNOSIS — Z79890 Hormone replacement therapy (postmenopausal): Secondary | ICD-10-CM | POA: Diagnosis not present

## 2017-11-10 DIAGNOSIS — Z880 Allergy status to penicillin: Secondary | ICD-10-CM | POA: Insufficient documentation

## 2017-11-10 DIAGNOSIS — N912 Amenorrhea, unspecified: Secondary | ICD-10-CM | POA: Insufficient documentation

## 2017-11-10 DIAGNOSIS — Z885 Allergy status to narcotic agent status: Secondary | ICD-10-CM | POA: Diagnosis not present

## 2017-11-10 DIAGNOSIS — J45909 Unspecified asthma, uncomplicated: Secondary | ICD-10-CM | POA: Diagnosis not present

## 2017-11-10 DIAGNOSIS — Z881 Allergy status to other antibiotic agents status: Secondary | ICD-10-CM | POA: Diagnosis not present

## 2017-11-10 DIAGNOSIS — Q969 Turner's syndrome, unspecified: Secondary | ICD-10-CM | POA: Diagnosis not present

## 2017-11-10 DIAGNOSIS — K219 Gastro-esophageal reflux disease without esophagitis: Secondary | ICD-10-CM | POA: Diagnosis not present

## 2017-11-10 DIAGNOSIS — E063 Autoimmune thyroiditis: Secondary | ICD-10-CM | POA: Diagnosis not present

## 2017-11-10 HISTORY — PX: DILATATION & CURRETTAGE/HYSTEROSCOPY WITH RESECTOCOPE: SHX5572

## 2017-11-10 LAB — PREGNANCY, URINE: Preg Test, Ur: NEGATIVE

## 2017-11-10 SURGERY — DILATATION & CURETTAGE/HYSTEROSCOPY WITH RESECTOCOPE
Anesthesia: General | Site: Vagina

## 2017-11-10 MED ORDER — MIDAZOLAM HCL 2 MG/2ML IJ SOLN
INTRAMUSCULAR | Status: AC
  Filled 2017-11-10: qty 2

## 2017-11-10 MED ORDER — OXYCODONE HCL 5 MG/5ML PO SOLN: 5.0000 mg | Freq: Once | ORAL | Status: DC | PRN

## 2017-11-10 MED ORDER — FENTANYL CITRATE (PF) 100 MCG/2ML IJ SOLN
INTRAMUSCULAR | Status: DC | PRN
  Administered 2017-11-10 (×2): 50 ug via INTRAVENOUS

## 2017-11-10 MED ORDER — ONDANSETRON HCL 4 MG/2ML IJ SOLN
INTRAMUSCULAR | Status: AC
  Filled 2017-11-10: qty 2

## 2017-11-10 MED ORDER — GLYCOPYRROLATE 0.2 MG/ML IJ SOLN
INTRAMUSCULAR | Status: AC
  Filled 2017-11-10: qty 1

## 2017-11-10 MED ORDER — FENTANYL CITRATE (PF) 100 MCG/2ML IJ SOLN
INTRAMUSCULAR | Status: AC
  Filled 2017-11-10: qty 2

## 2017-11-10 MED ORDER — PROPOFOL 10 MG/ML IV BOLUS
INTRAVENOUS | Status: AC
  Filled 2017-11-10: qty 20

## 2017-11-10 MED ORDER — LACTATED RINGERS IV SOLN
INTRAVENOUS | Status: DC
  Administered 2017-11-10: 08:00:00 via INTRAVENOUS

## 2017-11-10 MED ORDER — ACETAMINOPHEN 160 MG/5ML PO SOLN: 325.0000 mg | ORAL | Status: DC | PRN

## 2017-11-10 MED ORDER — SODIUM CHLORIDE 0.9 % IR SOLN
Status: DC | PRN
  Administered 2017-11-10: 3000 mL

## 2017-11-10 MED ORDER — DEXAMETHASONE SODIUM PHOSPHATE 10 MG/ML IJ SOLN
INTRAMUSCULAR | Status: DC | PRN
  Administered 2017-11-10: 4 mg via INTRAVENOUS

## 2017-11-10 MED ORDER — KETOROLAC TROMETHAMINE 30 MG/ML IJ SOLN
INTRAMUSCULAR | Status: DC | PRN
  Administered 2017-11-10: 30 mg via INTRAVENOUS

## 2017-11-10 MED ORDER — IBUPROFEN 600 MG PO TABS: 600.0000 mg | ORAL_TABLET | Freq: Four times a day (QID) | ORAL | 0 refills | Status: DC | PRN

## 2017-11-10 MED ORDER — LIDOCAINE HCL 2 % IJ SOLN
INTRAMUSCULAR | Status: DC | PRN
  Administered 2017-11-10: 10 mL

## 2017-11-10 MED ORDER — KETOROLAC TROMETHAMINE 30 MG/ML IJ SOLN
INTRAMUSCULAR | Status: AC
  Filled 2017-11-10: qty 1

## 2017-11-10 MED ORDER — GLYCOPYRROLATE 0.2 MG/ML IJ SOLN
INTRAMUSCULAR | Status: DC | PRN
  Administered 2017-11-10: 0.2 mg via INTRAVENOUS

## 2017-11-10 MED ORDER — SCOPOLAMINE 1 MG/3DAYS TD PT72
1.0000 | MEDICATED_PATCH | Freq: Once | TRANSDERMAL | Status: DC
  Administered 2017-11-10: 1.5 mg via TRANSDERMAL

## 2017-11-10 MED ORDER — FENTANYL CITRATE (PF) 100 MCG/2ML IJ SOLN: 25.0000 ug | INTRAMUSCULAR | Status: DC | PRN

## 2017-11-10 MED ORDER — ACETAMINOPHEN 325 MG PO TABS: 325.0000 mg | ORAL_TABLET | ORAL | Status: DC | PRN

## 2017-11-10 MED ORDER — LIDOCAINE HCL (CARDIAC) 20 MG/ML IV SOLN
INTRAVENOUS | Status: AC
  Filled 2017-11-10: qty 5

## 2017-11-10 MED ORDER — DEXAMETHASONE SODIUM PHOSPHATE 4 MG/ML IJ SOLN
INTRAMUSCULAR | Status: AC
  Filled 2017-11-10: qty 1

## 2017-11-10 MED ORDER — ONDANSETRON HCL 4 MG/2ML IJ SOLN: 4.0000 mg | Freq: Once | INTRAMUSCULAR | Status: DC | PRN

## 2017-11-10 MED ORDER — LIDOCAINE HCL 2 % IJ SOLN
INTRAMUSCULAR | Status: AC
  Filled 2017-11-10: qty 20

## 2017-11-10 MED ORDER — MEPERIDINE HCL 25 MG/ML IJ SOLN: 6.2500 mg | INTRAMUSCULAR | Status: DC | PRN

## 2017-11-10 MED ORDER — ONDANSETRON HCL 4 MG/2ML IJ SOLN
INTRAMUSCULAR | Status: DC | PRN
  Administered 2017-11-10: 4 mg via INTRAVENOUS

## 2017-11-10 MED ORDER — LIDOCAINE HCL (CARDIAC) 20 MG/ML IV SOLN
INTRAVENOUS | Status: DC | PRN
  Administered 2017-11-10: 50 mg via INTRAVENOUS

## 2017-11-10 MED ORDER — SCOPOLAMINE 1 MG/3DAYS TD PT72
MEDICATED_PATCH | TRANSDERMAL | Status: AC
  Filled 2017-11-10: qty 1

## 2017-11-10 MED ORDER — MIDAZOLAM HCL 2 MG/2ML IJ SOLN
INTRAMUSCULAR | Status: DC | PRN
  Administered 2017-11-10: 1 mg via INTRAVENOUS

## 2017-11-10 MED ORDER — KETOROLAC TROMETHAMINE 30 MG/ML IJ SOLN: 30.0000 mg | Freq: Once | INTRAMUSCULAR | Status: DC | PRN

## 2017-11-10 MED ORDER — PROPOFOL 10 MG/ML IV BOLUS
INTRAVENOUS | Status: DC | PRN
  Administered 2017-11-10: 150 mg via INTRAVENOUS

## 2017-11-10 MED ORDER — OXYCODONE HCL 5 MG PO TABS: 5.0000 mg | ORAL_TABLET | Freq: Once | ORAL | Status: DC | PRN

## 2017-11-10 SURGICAL SUPPLY — 21 items
BIPOLAR CUTTING LOOP 21FR (ELECTRODE)
CANISTER SUCT 3000ML PPV (MISCELLANEOUS) ×2 IMPLANT
CATH ROBINSON RED A/P 16FR (CATHETERS) ×2 IMPLANT
CLOTH BEACON ORANGE TIMEOUT ST (SAFETY) ×2 IMPLANT
DILATOR CANAL MILEX (MISCELLANEOUS) IMPLANT
ELECT REM PT RETURN 9FT ADLT (ELECTROSURGICAL) ×2
ELECTRODE REM PT RTRN 9FT ADLT (ELECTROSURGICAL) ×1 IMPLANT
GLOVE BIO SURGEON STRL SZ 6.5 (GLOVE) ×2 IMPLANT
GLOVE BIOGEL PI IND STRL 7.0 (GLOVE) ×2 IMPLANT
GLOVE BIOGEL PI INDICATOR 7.0 (GLOVE) ×2
GOWN STRL REUS W/TWL LRG LVL3 (GOWN DISPOSABLE) ×4 IMPLANT
LOOP CUTTING BIPOLAR 21FR (ELECTRODE) IMPLANT
PACK VAGINAL MINOR WOMEN LF (CUSTOM PROCEDURE TRAY) ×2 IMPLANT
PAD OB MATERNITY 4.3X12.25 (PERSONAL CARE ITEMS) ×2 IMPLANT
PIPET BIOPSY ENDOMETRIAL 3MM (SUCTIONS) ×1 IMPLANT
SCOPETTES 8  STERILE (MISCELLANEOUS) ×1
SCOPETTES 8 STERILE (MISCELLANEOUS) IMPLANT
SYR 20CC LL (SYRINGE) ×2 IMPLANT
TOWEL OR 17X24 6PK STRL BLUE (TOWEL DISPOSABLE) ×4 IMPLANT
TUBING AQUILEX INFLOW (TUBING) ×2 IMPLANT
TUBING AQUILEX OUTFLOW (TUBING) ×2 IMPLANT

## 2017-11-10 NOTE — Anesthesia Preprocedure Evaluation (Signed)
Anesthesia Evaluation  Patient identified by MRN, date of birth, ID band Patient awake    Reviewed: Allergy & Precautions, NPO status , Patient's Chart, lab work & pertinent test results  Airway Mallampati: II  TM Distance: >3 FB Neck ROM: Full    Dental no notable dental hx.    Pulmonary neg pulmonary ROS, asthma ,    Pulmonary exam normal breath sounds clear to auscultation       Cardiovascular negative cardio ROS Normal cardiovascular exam Rhythm:Regular Rate:Normal     Neuro/Psych negative neurological ROS  negative psych ROS   GI/Hepatic negative GI ROS, Neg liver ROS, GERD  Medicated,  Endo/Other  negative endocrine ROSHypothyroidism Morbid obesity  Renal/GU negative Renal ROS  negative genitourinary   Musculoskeletal negative musculoskeletal ROS (+)   Abdominal   Peds negative pediatric ROS (+)  Hematology negative hematology ROS (+)   Anesthesia Other Findings 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  Reproductive/Obstetrics negative OB ROS                            Anesthesia Physical Anesthesia Plan  ASA: III  Anesthesia Plan: General   Post-op Pain Management:    Induction: Intravenous  PONV Risk Score and Plan: 3 and Treatment may vary due to age or medical condition, Ondansetron and Dexamethasone  Airway Management Planned: LMA and Oral ETT  Additional Equipment:   Intra-op Plan:   Post-operative Plan: Extubation in OR  Informed Consent: I have reviewed the patients History and Physical, chart, labs and discussed the procedure including the risks, benefits and alternatives for the proposed anesthesia with the patient or authorized representative who has indicated his/her understanding and acceptance.     Plan Discussed with: CRNA, Surgeon and Anesthesiologist  Anesthesia Plan Comments: ( )        Anesthesia Quick  Evaluation

## 2017-11-10 NOTE — Anesthesia Postprocedure Evaluation (Signed)
Anesthesia Post Note  Patient: Carolyn [name]  Procedure(s) Performed: DILATATION & CURETTAGE/HYSTEROSCOPY (N/A Vagina )     Patient location during evaluation: PACU Anesthesia Type: General Level of consciousness: awake and alert Pain management: pain level controlled Vital Signs Assessment: post-procedure vital signs reviewed and stable Respiratory status: spontaneous breathing, nonlabored ventilation, respiratory function stable and patient connected to nasal cannula oxygen Cardiovascular status: blood pressure returned to baseline and stable Postop Assessment: no apparent nausea or vomiting Anesthetic complications: no    Last Vitals:  Vitals:   11/10/17 1015 11/10/17 1030  BP: 133/80   Pulse: 77 71  Resp: 16 16  Temp:    SpO2: 100% 100%    Last Pain:  Vitals:   11/10/17 1000  TempSrc:   PainSc: Asleep   Pain Goal: Patients Stated Pain Goal: 4 (11/10/17 0949)               Adrien Dietzman

## 2017-11-10 NOTE — Discharge Instructions (Signed)
DISCHARGE INSTRUCTIONS: D&C / D&E The following instructions have been prepared to help you care for yourself upon your return home.   Personal hygiene:  Use sanitary pads for vaginal drainage, not tampons.  Shower the day after your procedure.  NO tub baths, pools or Jacuzzis for 2-3 weeks.  Wipe front to back after using the bathroom.  Activity and limitations:  Do NOT drive or operate any equipment for 24 hours. The effects of anesthesia are still present and drowsiness may result.  Do NOT rest in bed all day.  Walking is encouraged.  Walk up and down stairs slowly.  You may resume your normal activity in one to two days or as indicated by your physician.  Sexual activity: NO intercourse for at least 2 weeks after the procedure, or as indicated by your physician.  Diet: Eat a light meal as desired this evening. You may resume your usual diet tomorrow.  Return to work: You may resume your work activities in one to two days or as indicated by your doctor.  What to expect after your surgery: Expect to have vaginal bleeding/discharge for 2-3 days and spotting for up to 10 days. It is not unusual to have soreness for up to 1-2 weeks. You may have a slight burning sensation when you urinate for the first day. Mild cramps may continue for a couple of days. You may have a regular period in 2-6 weeks.  Call your doctor for any of the following:  Excessive vaginal bleeding, saturating and changing one pad every hour.  Inability to urinate 6 hours after discharge from hospital.  Pain not relieved by pain medication.  Fever of 100.4 F or greater.  Unusual vaginal discharge or odor.  Can take ibuprofen/motrin/advil at 3:30PM  On Friday  11/10/17 Drink extra water next 48 hours Can use heating pad to abdomen   Call for an appointment:    Patients signature: ______________________  Nurses signature ________________________  Support person's  signature_______________________     Post Anesthesia Home Care Instructions  Activity: Get plenty of rest for the remainder of the day. A responsible individual must stay with you for 24 hours following the procedure.  For the next 24 hours, DO NOT: -Drive a car -Advertising copywriterperate machinery -Drink alcoholic beverages -Take any medication unless instructed by your physician -Make any legal decisions or sign important papers.  Meals: Start with liquid foods such as gelatin or soup. Progress to regular foods as tolerated. Avoid greasy, spicy, heavy foods. If nausea and/or vomiting occur, drink only clear liquids until the nausea and/or vomiting subsides. Call your physician if vomiting continues.  Special Instructions/Symptoms: Your throat may feel dry or sore from the anesthesia or the breathing tube placed in your throat during surgery. If this causes discomfort, gargle with warm salt water. The discomfort should disappear within 24 hours.  If you had a scopolamine patch placed behind your ear for the management of post- operative nausea and/or vomiting:  1. The medication in the patch is effective for 72 hours, after which it should be removed.  Wrap patch in a tissue and discard in the trash. Wash hands thoroughly with soap and water. 2. You may remove the patch earlier than 72 hours if you experience unpleasant side effects which may include dry mouth, dizziness or visual disturbances. 3. Avoid touching the patch. Wash your hands with soap and water after contact with the patch.

## 2017-11-10 NOTE — H&P (Signed)
Carolyn [name] is an 27 y.o. female. Presenting for D&C hysteroscopy for amenorrhea with thickened endometrium.  Pt has Turners syndrome and is on hormonal therapy.  She has not had a menses for over a year and has thickened endometrium. She did not tolerate and EMBx in the office so I will attempt a D&C in the OR  Pertinent Gynecological History: Menses: see above Bleeding: see above Contraception: abstinence DES exposure: denies Blood transfusions: none Sexually transmitted diseases: no past history Previous GYN Procedures: none  Last mammogram: na Date:  Last pap: normal Date:  OB History: G0, Menstrual History: Menarche age: see above Patient's last menstrual period was 11/04/2017 (approximate).    Past Medical History:  Diagnosis Date  . Amenorrhea, primary   . Anemia   . Asthma   . 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 Eczema   . GE reflux   . Goiter   . Hashimoto's thyroiditis   . Hypothyroidism   . Premature ovarian failure   . Seasonal allergies     Past Surgical History:  Procedure Laterality Date  . ADENOIDECTOMY    . DOUBLE OUTLET RIGHT VENTRICLE REPAIR  2009   Doing great since surgery, no problems  . TONSILLECTOMY    . TONSILLECTOMY AND ADENOIDECTOMY      Family History  Problem Relation Age of Onset  . Cancer Paternal Grandfather        Lung CA  . Obesity Father   . Cancer Paternal Grandmother        breast CA  . Diabetes Neg Hx   . Hypertension Neg Hx   . Kidney disease Neg Hx   . Thyroid disease Neg Hx   . Anemia Neg Hx   . Allergic rhinitis Neg Hx   . Angioedema Neg Hx   . Asthma Neg Hx   . Eczema Neg Hx   . Immunodeficiency Neg Hx     Social History:  reports that she is a non-smoker but has been exposed to tobacco smoke. she has never used smokeless tobacco. She reports that she does not drink alcohol or use drugs.  Allergies:  Allergies  Allergen Reactions  . Clarithromycin Hives  . Codeine Other (See Comments)     Unsure of reaction type  . Morphine And Related Nausea And Vomiting  . Penicillins     Has patient had a PCN reaction causing immediate rash, facial/tongue/throat swelling, SOB or lightheadedness with hypotension: Unknown Has patient had a PCN reaction causing severe rash involving mucus membranes or skin necrosis: Unknown Has patient had a PCN reaction that required hospitalization: No Has patient had a PCN reaction occurring within the last 10 years: No If all of the above answers are "NO", then may proceed with Cephalosporin use.   [name] [Cefdinir] Rash    Medications Prior to Admission  Medication Sig Dispense Refill Last Dose  . CALCIUM PO Calcium 500 + D  daily   Past Month at Unknown time  . cetirizine (ZYRTEC) 10 MG tablet Take one tablet daily for runny nose or itching. 90 tablet 1 11/09/2017 at Unknown time  . Cholecalciferol (VITAMIN D PO) Vitamin D  weekly   Past Week at Unknown time  . estradiol (VIVELLE-DOT) 0.1 MG/24HR patch PLACE 1 PATCH ONTO THE SKIN 2 (TWO) TIMES A WEEK. (Patient taking differently: PLACE 1 PATCH ONTO THE SKIN 2 (TWO) TIMES A WEEK. (MONDAY & THURSDAY)) 24 patch 4 11/09/2017 at Unknown time  .  ibuprofen (ADVIL,MOTRIN) 200 MG tablet Take 200 mg by mouth every 8 (eight) hours as needed (for pain.).   Past Month at Unknown time  . levothyroxine (SYNTHROID, LEVOTHROID) 100 MCG tablet TAKE ONE TABLET BY MOUTH EVERY DAY. 90 tablet 4 11/10/2017 at 0600  . medroxyPROGESTERone (PROVERA) 10 MG tablet Take 10 mg by mouth See admin instructions. TAKE 1 TABLET EVERY DAY FOR 14 DAYS OUT THE MONTH  6 Past Month at Unknown time  . montelukast (SINGULAIR) 10 MG tablet TAKE ONE TABLET EACH EVENING AT BEDTIME TO PREVENT COUGH OR WHEEZE. 30 tablet 0 Past Week at Unknown time  . omeprazole (PRILOSEC) 20 MG capsule TAKE 1 CAPSULE (20 MG TOTAL) BY MOUTH 2 (TWO) TIMES DAILY. 180 capsule 4 11/10/2017 at 0600  . albuterol (PROVENTIL HFA;VENTOLIN HFA) 108 (90 Base) MCG/ACT inhaler  Inhale 2 puffs into the lungs every 4 (four) hours as needed for wheezing or shortness of breath.   More than a month at Unknown time  . albuterol (PROVENTIL) (2.5 MG/3ML) 0.083% nebulizer solution Take 2.5 mg by nebulization every 6 (six) hours as needed (FOR WHEEZING/SHORTNESS OF BREATH.).    Unknown at Unknown time  . montelukast (SINGULAIR) 10 MG tablet Take 1 tablet (10 mg total) by mouth at bedtime. 30 tablet 6     ROS  Blood pressure 128/79, pulse 67, temperature 98.4 F (36.9 C), temperature source Oral, resp. rate 16, height 5\' 1"  (1.549 m), weight 77.1 kg (170 lb), last menstrual period 11/04/2017, SpO2 100 %. Physical Exam  Physical Examination: General appearance - alert, well appearing, and in no distress Chest - clear to auscultation, no wheezes, rales or rhonchi, symmetric air entry Heart - normal rate and regular rhythm Abdomen - soft, nontender, nondistended, no masses or organomegaly Pelvic - normal external genitalia, vulva, vagina, cervix, uterus and adnexa, appears atrophic.  Hypoplastic uterus Extremities - Homan's sign negative bilaterally  Results for orders placed or performed during the hospital encounter of 11/10/17 (from the past 24 hour(s))  Pregnancy, urine     Status: None   Collection Time: 11/10/17  7:15 AM  Result Value Ref Range   Preg Test, Ur NEGATIVE NEGATIVE    No results found.  Assessment/Plan: Thickened Endometrium with Turners syndrome [facility] Risks and benefits reviewed with the father and pt  Sanjiv Castorena A 11/10/2017, 8:50 AM

## 2017-11-10 NOTE — Anesthesia Procedure Notes (Signed)
Procedure Name: LMA Insertion Date/Time: 11/10/2017 9:06 AM Performed by: Shanon PayorGregory, Omero Kowal M, CRNA Pre-anesthesia Checklist: Patient identified, Emergency Drugs available, Suction available, Patient being monitored and Timeout performed Patient Re-evaluated:Patient Re-evaluated prior to induction Oxygen Delivery Method: Circle system utilized Preoxygenation: Pre-oxygenation with 100% oxygen Induction Type: IV induction LMA: LMA inserted LMA Size: 4.0 Number of attempts: 1 Placement Confirmation: positive ETCO2 and breath sounds checked- equal and bilateral Tube secured with: Tape Dental Injury: Teeth and Oropharynx as per pre-operative assessment

## 2017-11-10 NOTE — Op Note (Signed)
Pre op DX: Amenorrhea   Post Op GE:XBMWX:same with thickened endometrium   PHYSICIAN : Netasha Wehrli   ASSISTANTS: none   ANESTHESIA:   LMA general  and paracervical block  ESTIMATED BLOOD LOSS: minimal  LOCAL MEDICATIONS USED:  LIDOCAINE 20CC  SPECIMEN:  Source of Specimen:  endometrial curettings  DISPOSITION OF SPECIMEN:  PATHOLOGY  COUNTS Correct:  YES    DICTATION #: The patient was taken to the operating room and prepped and draped in a normal sterile fashion. An in out catheter was used to drain the bladder.   A bivalve speculum was placed into the vagina and anterior lip of the cervix was grasped with a single-tooth tenaculum.  20 cc of 1% lidocaine was used for cervical block.  the cervix was then dilated with Shawnie PonsPratt dilators up to 21. The hysteroscope was placed into the uterine cavity. The  entire uterus and both ostia were visualized.  Biopsy forceps were used to biopsy some endometrium.    Hyseroscope was then removed from the uterus. A sharp curettage was then done with a curette and endometrial curettings were obtained. The endometrial curettings were sent to pathology. Again the hysteroscope was placed into the uterine cavity. Both ostia were again visualized there were no polyps or submucosal fibroids or endometrial masses seen. The tenaculum was removed from the cervix and hemostasis was noted.   PLAN OF CARE: discharge to home  PATIENT DISPOSITION:  PACU - hemodynamically stable.

## 2017-11-10 NOTE — Transfer of Care (Signed)
Immediate Anesthesia Transfer of Care Note  Patient: Carolyn [name]  Procedure(s) Performed: DILATATION & CURETTAGE/HYSTEROSCOPY (N/A Vagina )  Patient Location: PACU  Anesthesia Type:General  Level of Consciousness: awake, alert  and oriented  Airway & Oxygen Therapy: Patient Spontanous Breathing and Patient connected to nasal cannula oxygen  Post-op Assessment: Report given to RN and Post -op Vital signs reviewed and stable  Post vital signs: Reviewed and stable  Last Vitals:  Vitals:   11/10/17 0726  BP: 128/79  Pulse: 67  Resp: 16  Temp: 36.9 C  SpO2: 100%    Last Pain:  Vitals:   11/10/17 0726  TempSrc: Oral      Patients Stated Pain Goal: 4 (11/10/17 0726)  Complications: No apparent anesthesia complications

## 2017-11-11 ENCOUNTER — Encounter (HOSPITAL_COMMUNITY): Payer: Self-pay | Admitting: Obstetrics and Gynecology

## 2018-02-07 LAB — T3, FREE: T3, Free: 2.9 pg/mL (ref 2.3–4.2)

## 2018-02-07 LAB — T4, FREE: Free T4: 1.5 ng/dL (ref 0.8–1.8)

## 2018-02-07 LAB — TSH: TSH: 5.41 mIU/L — ABNORMAL HIGH

## 2018-02-08 LAB — PTH, INTACT AND CALCIUM
Calcium: 9.2 mg/dL (ref 8.6–10.2)
PTH: 34 pg/mL (ref 14–64)

## 2018-02-08 LAB — VITAMIN D 25 HYDROXY (VIT D DEFICIENCY, FRACTURES): Vit D, 25-Hydroxy: 33 ng/mL (ref 30–100)

## 2018-02-26 ENCOUNTER — Encounter (INDEPENDENT_AMBULATORY_CARE_PROVIDER_SITE_OTHER): Payer: Self-pay | Admitting: *Deleted

## 2018-02-26 ENCOUNTER — Telehealth (INDEPENDENT_AMBULATORY_CARE_PROVIDER_SITE_OTHER): Payer: Self-pay

## 2018-02-26 NOTE — Telephone Encounter (Signed)
Left voicemail to call back so we may discuss lab results.

## 2018-02-26 NOTE — Telephone Encounter (Signed)
Spoke with patient and let her know Per Dr. Fransico Michael " PTH, calcium, and vitamin D are normal. Thyroid tests are now hypothyroid. If she is still taking Synthroid, 100 mcg/day for 6 days each week, please increase the dose to one pill every day." Patient states understanding and was able to repeat rx change correctly. Reminded patient of 03/13/2018 appointment.

## 2018-02-26 NOTE — Telephone Encounter (Signed)
-----   Message from David Stall, MD sent at 02/22/2018  1:10 PM EDT ----- PTH, calcium, and vitamin D are normal. Thyroid tests are now hypothyroid. If she is still taking Synthroid, 100 mcg/day for 6 days each week, please increase the dose to one pill every day.

## 2018-03-13 ENCOUNTER — Ambulatory Visit (INDEPENDENT_AMBULATORY_CARE_PROVIDER_SITE_OTHER): Payer: BLUE CROSS/BLUE SHIELD | Admitting: "Endocrinology

## 2018-03-13 ENCOUNTER — Encounter (INDEPENDENT_AMBULATORY_CARE_PROVIDER_SITE_OTHER): Payer: Self-pay | Admitting: "Endocrinology

## 2018-03-13 VITALS — BP 124/76 | HR 88 | Wt 169.0 lb

## 2018-03-13 DIAGNOSIS — E049 Nontoxic goiter, unspecified: Secondary | ICD-10-CM | POA: Diagnosis not present

## 2018-03-13 DIAGNOSIS — I10 Essential (primary) hypertension: Secondary | ICD-10-CM

## 2018-03-13 DIAGNOSIS — E559 Vitamin D deficiency, unspecified: Secondary | ICD-10-CM | POA: Diagnosis not present

## 2018-03-13 DIAGNOSIS — N910 Primary amenorrhea: Secondary | ICD-10-CM

## 2018-03-13 DIAGNOSIS — E063 Autoimmune thyroiditis: Secondary | ICD-10-CM

## 2018-03-13 DIAGNOSIS — Z6834 Body mass index (BMI) 34.0-34.9, adult: Secondary | ICD-10-CM

## 2018-03-13 DIAGNOSIS — Q51811 Hypoplasia of uterus: Secondary | ICD-10-CM

## 2018-03-13 DIAGNOSIS — E6609 Other obesity due to excess calories: Secondary | ICD-10-CM

## 2018-03-13 DIAGNOSIS — K219 Gastro-esophageal reflux disease without esophagitis: Secondary | ICD-10-CM | POA: Diagnosis not present

## 2018-03-13 DIAGNOSIS — R7303 Prediabetes: Secondary | ICD-10-CM | POA: Diagnosis not present

## 2018-03-13 LAB — POCT GLUCOSE (DEVICE FOR HOME USE): Glucose Fasting, POC: 97 mg/dL (ref 70–99)

## 2018-03-13 LAB — POCT GLYCOSYLATED HEMOGLOBIN (HGB A1C): Hemoglobin A1C: 5.4 % (ref 4.0–5.6)

## 2018-03-13 NOTE — Patient Instructions (Signed)
Follow up visit in 5 months. Please repeat lab tests in late July and again 2 weeks prior to next visit.

## 2018-03-13 NOTE — Progress Notes (Addendum)
Chief complaint: Follow-up of primary amenorrhea, congenital hypoplastic uterus, acquired autoimmune hypothyroidism, Hashimoto's thyroiditis, goiter, kyphosis, scoliosis, osteopenia, s/p repair of double outlet right ventricle, and gastroesophageal reflux disease  History of present illness: The patient is a 27 year-old Caucasian young woman. She was unaccompanied.  1. We have followed this young woman since 10/28/2010 when she was referred by Dr. Crawford Givens, [facility] Gynecology, for evaluation of hypothyroidism and primary amenorrhea.   A. At age 46 months the child was noted to be somewhat slow to walk. She was referred to a pediatric neurologist, Dr. Wyline Copas, for further evaluation. Dr. Gaynell Face noted some minor gross motor and fine motor delays, but also noted a significant cardiac murmur. 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 After several years of follow-up, the patient underwent corrective surgery in 2009. She has done well clinically since that time.  B. 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 The geneticist noted the presence of a scoliosis and some limb length discrepancy. Deija had flattening of the left occipital region as compared to the right. Her scalp hair was thin, especially sparse in the temporal regions. The forehead was flat. She had short palpebral fissures and a wide nasal bridge. Epicanthal folds were present. She had a thin upper lip and some underbite. She also had facial asymmetry with the right cheek being more prominent than the left. The lower lip was also more prominent on the right than on the left. The neck was short and wide. The nipples were felt to be widely spaced. Labia majora were slightly  hypoplastic. She had mild soft tissue syndactyly between the third and fourth digits bilaterally. The right leg was longer than the left.  The right thigh was somewhat longer than the left thigh. The right foot was longer than the left foot. All of the right toes were larger than the left toes. Left nails were smaller and dystrophic. She also had a thoracolumbar scoliosis with convexity to the right. She walked with a tilt of her pelvis to the left side. 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 Because the clinical findings suggested the possibility of a genetic disorder, a karyotype was obtained. She had a 44, XX karyotype. A FISH study was also performed. This study showed no evidence for the common form of DiGeorge Syndrome. The geneticist was not able to make a specific genetic diagnosis. Although the geneticist requested that the family return for a follow-up examination, it appears that did not happen.  C. On 09/03/10 the patient was evaluated at [place]. The reason for referral was primary amenorrhea at age 6. The patient's breasts were noted to be Tanner stage III. The vagina looked normal externally. The patient was unable to tolerate either a speculum exam or pelvic exam. Pregnancy test was negative. Laboratory data showed a TSH of 9.296, free T4 0.91, and T3 of 126.5. Her TSH was definitely elevated. The free T4 and T3 were in the lower portion of the normal range. FSH was very elevated at  92.5. Prolactin was 4.2. Repeat karyotype was 36, XX. A pelvic ultrasound was performed in the [facility] office. The uterus and ovaries were not clearly seen. Endometrial stripe was not seen. It  was commented that additional imaging may be necessary.  D. On 10/28/10 the patient was seen by our former physician assistant, who obtained additional history to include past issues with allergies, asthma, and gastroesophageal  reflux disease. At that point the patient was taking Singulair daily, Advair daily, omeprazole daily, and cetirizine (Zyrtec) as needed. On examination the patient weighed 148 pounds which was at the 80th percentile. Her height was at the 8th percentile. Her blood pressure was 124/80 and heart rate was 68. She did have a somewhat dysmorphic facies. A mildly enlarged, but diffusely enlarged, nontender goiter was noted. Laboratory data showed a TSH of 7.517, free T4 of 1.02, and free T3 of 3.4. FSH was 90 and LH was 35. Testosterone was 23.56. Estradiol was 17.2. She definitely had both primary hypogonadism and primary hypothyroidism. At that point she was started on Synthroid, 25 mcg per day.  2. Upon our physician assistant's departure at the end of March 2012, the patient was rescheduled to see me on 01/31/2011. She was taking her Synthroid 25, mcg per day at that time. Her goiter was approximately 20-25 grams in size. The thyroid was non-tender. She was still amenorrheic. Thyroid tests done on that day showed a TSH of 1.667, free T4  1.26, and free T3 3.5. These tests were mid-range normal. Her TPO antibody was elevated at 299, c/w Hashimoto's thyroiditis. She was trying to follow our  Eat Right Diet plan. She was also walking at least an hour per day. She had lost 16 pounds. At her visit on 08/30/11, I met with the patient and her father.The patient was then taking Synthroid, 37.5 mcg/day.  I learned that her mother was not attending the visits in our clinic on the building's third floor because the mother was very afraid of heights and elevators. I made arrangements to see the patient and parents in the first floor radiology conference room on 09/13/11. Dr. Janeal Holmes, MD, PhD, our staff geneticist, joined me on that visit and we provided education to the family about possible Turner's Syndrome mosaicism, hypothyroidism, and Hashimoto's diease.   3. On 03/15/12 Dr. Abelina Bachelor and I contacted Dr. Freddy Jaksch, pediatric endocrinologist at [facility] and a nationally recognized expert on Turner's Syndrome. We presented Anuradha's case to Dr. Melina Fiddler. Although Dr. Melina Fiddler agreed that Ander Purpura likely had a genetic basis for her complex congenital heart disease and other issues, Dr. Melina Fiddler did not feel that Breianna had Turner's mosaicism, but Dr, Melina Fiddler could not identify any other recognized syndrome that would fit Jessabelle. When I asked Dr. Melina Fiddler her opinion as to whether or not we needed to surgically define the gonads before beginning hormone replacement therapy, Dr. Melina Fiddler stated that we probably did not need to do so, but she did understand my concern that if an ovotestis or ovotestes were present, it/they should be removed due to the risk of possible later development of gonadal cancer. Dr. Melina Fiddler suggested that when we were ready to start hormone replacement therapy we use Vivelle-Dot transdermal estradiol, beginning with a dose of 25 mcg/day and gradually increasing the dose to 100 mcg/day over 1-2 years time. We should then wait for spontaneous occurrence of menses.  We could then add a progestin, such as Prometrium, on days 1-10 of each month.     4. In the succeeding six years Cheral's Hashimoto's disease T lymphocytes have gradually but progressively destroyed more thyrocytes. In response, we have gradually increased the patient's Synthroid dosage to 100 mcg/day on 6-7 days  per week in order to keep her euthyroid. We have also been gradually increasing the strength of her Vivelle-Dot patches.  5. The patient's last [facility] clinic visit was on 11/09/17. At her last visit I continued all of her medications. In the interim she has been healthy. After reviewing her lab results from 02/07/18 I increased her Synthroid to 100 mcg/day.   AAnder Purpura has not noticed any changes in breast size or breast tenderness.   B. She saw Dr. Charlesetta Garibaldi on 06/26/17 and on 07/11/17. Dr. Charlesetta Garibaldi added Provera for 14 days  each month. Now Jassmin has regular periods following each cycle of Provera.   Ledell Noss had her D&C and hysteroscopy on 11/10/17. One vaginal polyp was noted. The endometrium was essentially acellular. Hawley understood Dr. Charlesetta Garibaldi to tell her that everything was fine.   C. She remains on the mini-Vivelle 0.100 patch every 3-4 days; Synthroid 100 mcg/day; omeprazole, 20 mg, twice daily; and Citracal-D twice daily. She also takes Biotech form of vitamin D, 50,000 IU per week.   D. She is eating about the same or less. She has been walking more, about an hour on 3-4 days per week.    6. Pertinent Review of Systems: Constitutional: The patient feels "good". She is healthy and has no significant complaints. Eyes: Vision is good. There are no significant eye complaints. Neck: The patient has no complaints of anterior neck swelling, soreness, tenderness,  pressure, discomfort, or difficulty swallowing.  Heart: The patient has no complaints of palpitations, irregular heat beats, chest pain, or chest pressure. She saw her [facility] pediatric cardiologist, Dr Ermalene Searing, in mid-October 2014. He gave hear a clean bill of health. Her next scheduled FU will be in October 2019. Gastrointestinal: Bowel movents seem normal. The patient has no complaints of excessive hunger, acid reflux, upset stomach, stomach aches or pains, diarrhea, or constipation. Legs: Muscle mass and strength seem normal. There are no complaints of numbness, tingling, burning, or pain. No edema is noted. Feet: There are no obvious foot problems. There are no complaints of numbness, tingling, burning, or pain. No edema is noted. Hair: She says that her scalp hair seems to be fine. She feels that she is producing and losing about equal amounts of scalp hair.     PAST MEDICAL, FAMILY, AND SOCIAL HISTORY:  1. School and Family: She remains unemployed. Dad and both paternal grandparents had thin hair.  2. Activities: She has resumed walking 60  minutes daily when the weather is good.   3. Tobacco, alcohol, and illicit drugs: None 4. PCP: Dr. Saddie Benders 5. GYN: Dr. Gwynneth Munson Dillard 6. Genetics: Dr. Janeal Holmes  REVIEW OF SYSTEMS: There are no other significant problems involving her other body systems.  PHYSICAL EXAM: BP 124/76   Pulse 88   Wt 169 lb (76.7 kg)   BMI 31.50 kg/m   Constitutional: The patient looks good today. She is letting her hair grow out more now. She was again interested in our discussions. She gave appropriate, very brief answers to questions, but did not initiate any conversation on her own. Her affect and insight appear to be fairly normal. She has gained about 3 pounds since her last visit.  Face: Her facies remain asymmetric/dysmorphic. The mouth is somewhat twisted rotationally, as before.  Eyes: There is no arcus or proptosis.  Mouth: The oropharynx appears normal. The tongue appears normal. There is normal oral moisture. There is no obvious gingivitis. Neck: There are no bruits present. The thyroid  gland is low-lying.The thyroid gland is again about 21+ grams in size. The right lobe has shrunk back to normal size. The left lobe and isthmus are larger today. The consistency of the thyroid gland is normal. There is no thyroid tenderness to palpation. Lungs: The lungs are clear. Air movement is good. Heart: The heart rhythm and rate appear normal. Heart sounds S1 and S2 are normal. I do not appreciate any pathologic heart murmurs.  Abdomen: The abdomen is still enlarged today. Bowel sounds are normal. The abdomen is soft and non-tender. There is no obviously palpable hepatomegaly, splenomegaly, or other masses.  Arms: Muscle mass appears appropriate for age.  Hands: There is no obvious tremor. Phalangeal and metacarpophalangeal joints appear normal. Palms are normal. Legs: Muscle mass appears appropriate for age. There is no edema.  Neurologic: Muscle strength is normal for age and gender  in both the  upper and the lower extremities. Muscle tone appears normal. Sensation to touch is normal in the legs. Hair: Her hair is thicker than it used to be, c/w estrogen effect. There are no bare spots, but the hair is visibly less dense along the part in her hair and at the vertex.  Laboratory data:     Labs 03/13/18: HbA1c 5.4%  Labs 02/07/18: TSH 5.41, free T4 1.5, free T3 2.9; 25-OH vitamin D 33, PTH 34, calcium 9.2  Labs 11/09/17: HbA1c 5.5%, CBG 96  Labs 11/03/17: CBC Hgb 9.8, Hct 31.4, MCV 68.9, MCH 21.5  Labs 08/15/17: TSH 0.22, free T4 2.1, free T3 3.3; 1, 25-OH vitamin D 53 ; LH 0.5, FSH 4.2, estradiol 79, testosterone 15; CMP normal  Labs 04/25/17: HbA1c 5.5%, CBG 92  Labs 04/12/17: TSH 0.65, free T4 1.8, free T3 3.3; LH 4.4, FSH 4.4, estradiol 76  Labs 12/19/16: HbA1c 5.3%, CBG 95  Labs 12/06/16: LH 4.0, FSH 5.5, estradiol 135, testosterone 19, free testosterone 2.3; TSH  0.29, free T4 1.7, free T3 3.1; PTH 38, calcium 9.1, vitamin D 53  Labs 08/18/16: HbA1c 5.7%  Labs 08/08/16: TSH 0.78, free T4 1.6, free T3 3.1; LH 3.1, FSH 5.8, estradiol 33, testosterone 16; PTH 25, calcium 9.0, 25-OH vitamin D 56  Labs 05/18/16: HbA1c 5.6%  Labs 04/25/16: TSH 2.20, free T4 1.6, free T3 2.8; PTH 30, calcium 9.1, 25-OH vitamin D 67;  estradiol 92  Labs 01/04/16: HbA1c 5.5%  Labs 12/28/15: TSH 0.83, free T4 1.7, free T3 3.0; PTH 54, calcium 9.2, 25-OH vitamin D 21; CMP normal; LH 2.0, FSH 6.3, estradiol 119,  testosterone  43, free testosterone 8.5  08/17/15: HbA1c 5.6%  Labs 08/10/15: TSH 0.942, free T4 1.49, free T3 2.7; LH 2.3, FSH 13.1, testosterone 69, estradiol 71.0; PTH 44, calcium 9.3, 25-OH vitamin D 19  Labs 04/02/15: HbA1c was 6.0%. PTH 36, calcium 9.5, 25-OH vitamin D 20; LH 6.6, FSH 13, estradiol 84.8, testosterone 37; TSH 2.152, free T4 1.31, free T3 2.9  Labs 11/24/14: HbA1c was 5.7% today, compared with 6.1% at last visit and with 5.8% at the visit prior; calcium 9.2, PTH 49; 25-OH  vitamin D 22; TSH 0.449, free T4 1.54, free T3 3.5; testosterone 41, estradiol 34.4  Labs 07/21/14: CMP normal; estradiol 30.2, FSH 64.1, LH 30.1, testosterone 41; TSH 3.596, free T4 1.75, free T3 3.3; PTH 42, calcium 9.4, 215-hydroxy vitamin D 33  Labs 12/24/13: TSH 0.768, free T4 1.42, free T3 3.0; estradiol 52.3, FSH 26.6, LH 10.3, testosterone 46; CMP normal   Labs 08/07/13: BMP normal, with  calcium 9.5; estradiol 22.7; FSH 46.7, LH 18.3; TSH 1.787, free T4 1.63, free T3 3.1; testosterone 43   Labs 05/03/13: Estradiol 17.5, testosterone 66, free testosterone 14.9, LH 44.8, FSH 91.4, 25-hydroxy vitamin D 32, 1,25-dihydroxy vitamin D 55  Labs 01/18/13: BMP normal, to include calcium of 9.9; TSH 1.156, free T4 1.51, free T3 3.1; testosterone 46 (increased from 33.05), estradiol 16.2 (increased from < 11.8); LH 34.4 (decreased from 36.5), FSH 86.3 (decreased from 106.1); PTH 36.8, phosphorus 4.0, 25-hydroxy vitamin D 27   Labs 10/12/12: TSH 2.079, free T4 1.44, free T3 2.9, LH 36.5,  FSH 106.2, estradiol <11.8, Testosterone 29.94, calcium 9.8, phosphorus 4.5, 25-vitamin D 29,   Labs 05/09/12: TSH 0.351, free T4 1.73, free T3 3.1   Labs 03/05/12: TSH 3.236, free T4 1.60, free T3 3.1   Labs 12/14/11: TSH 3.8, free T4 1.39, free T3 3.3, TPO 381, LH 33.4, FSH 86.9, testosterone 33.5, free testosterone 7.2 (normal 1.0-5.0)  Estradiol 13.6,                Labs 08/22/11: TSH 3.183, free T4 1.40, free T3 3.4. testosterone 34.45, estradiol 14.8     Buccal smear - normal  IMAGING:   MRI of pelvis 10/05/15: Uterus has divergent horns, but normal outer fundal contour. Associated fundal thickening is c/w septate uterus. Right ovary measures 11 x 8 mm and has a small simple cyst/follicle. Left ovary measures 11 x 8 mm aid is poorly evaluated but grossly unremarkable.   US pelvis 08/27/15: Uterus measured 9.1 x 3.2 x 4.8 cm. Endometrial thickness was 14 mm. Right ovary was not well visualized. Left ovary  measured 2.2 x 2.0 x 2.1 cm. The left ovary appeared normal. There was a non-specific hypoechoic area within the right adnexal region that might be secondary to overlying bowel structures.  BMD 05/29/13: BMD in spine was > 50%. BMD in the hip was above the 95%. Z-scores were 0.3 and 1.68 respectively.   ASSESSMENT:  1. Hypothyroidism: The patient was mildly hypothyroid in April 2019, so I increased her Synthroid dose accordingly.   2. Hashimoto's thyroiditis: Her Hashimoto's disease is clinically quiescent, but intermittently active. The pattern of all three TFTs shifting in parallel together in the same direction, upward or downward, that occurred between October 2014 and March 2015, is pathognomonic for having had an interim flare up of Hashimoto's thyroiditis.  3. Goiter: The thyroid gland is about the same size today, but the lobes have shifted in size again. The waxing and waning of thyroid gland size is also c/w evolving Hashimoto's disease.  4. Primary Amenorrhea/Congenital small (hypoplastic) uterus:   A. When she became euthyroid, she had increases in [facility] and [place] into the menopausal range. Since initiating estradiol therapy, however, her LH and FSH had decreased and her testosterone and estradiol had increased. Some of these increases may have been due to increased ovarian production of these hormones. Some of the increased testosterone may have been caused by the effects of overly fat adipose cell cytokines causing insulin resistance and hyperinsulinemia, the hyperinsulinemia in turn causing elevated testosterone. Some of the increased estradiol may have been due to increased aromatization of testosterone by adipose cells.   B. When we began estrogen replacement therapy, our immediate estrogen replacement goals were three-fold:   1. To promote estrogenization of breasts and vaginal tissues   2. To promote feminization of her brain and body overall   3. To improve bone mineral density   C.  Ander Purpura  has responded well to [place]. When she saw Dr. Charlesetta Garibaldi, Dr. Charlesetta Garibaldi started Provera for 14 days each month, resulting in monthly periods for Kathlene.  5. Obesity: Patient's weight has increased again. She needs to continue to burn off more calories than she takes in.   6. Developmental delays: As noted in my November 2012 note, the patient had an IQ test which gave her an IQ of 73. In many prior visits her interactions were c/w that IQ or perhaps a somewhat lower IQ.  At her last several visits and again at today's visit she was more engaged and acted quite normally.  7. GERD: Her GERD and dyspepsia are well-controlled with omeprazole. At this point she shows no signs of tachyphylaxis.  8. Kyphosis: Her repeat scoliosis series did not show progression. In fact, there was a suggestion of improvement. 9. Hypertension: Her diastolic BP is lower today. She needs to continue to eat right, increase daily exercise, and lose more fat weight.  10. Pre-diabetes: Her HbA1c is within normal limits and lower today.  11. Vitamin D deficiency disease: Her vitamin D level in October 2018 was good, c/w her taking her Biotech regularly. Her vitamin D level in April 2019 was lower. Try not to miss any doses of vitamin D.   PLAN: 1. Diagnostic: Repeat TFTs in late July. Repeat TFTs, 25-OH vitamin D, calcium, and PTH two weeks prior to her next visit.  2. Therapeutic: Continue the mini-Vivelle at 0.100 mg patch every 3-4 days or change as directed by Dr. Charlesetta Garibaldi.  Continue omeprazole, 20 mg, twice daily. Take one Citracal-D, twice daily, at lunch and dinner.  Continue Biotech one 50,00 IU capsule once a week. Add 1-2 Tums at bedtime. Continue the Synthroid dose of 100 mcg/day. 3. Patient education: We again discussed the fact that we started ERT in order to promote feminization and to achieve as much bone mineralization as possible. We still do not know if the uterus will grow enough for her to be a candidate for  in vitro fertilization. We will see what Dr. Berneta Sages plans are for Lorrayne's future GYN care.  4. Follow-up: Follow-up visit in 5 months.  Level of Service: This visit lasted in excess of 55 minutes. More than 50% of the visit was devoted to counseling.  Tillman Sers, MD, CDE Adult and Pediatric Endocrinology

## 2018-05-03 LAB — TSH: TSH: 0.27 mIU/L — ABNORMAL LOW

## 2018-05-03 LAB — T4, FREE: Free T4: 1.5 ng/dL (ref 0.8–1.8)

## 2018-05-03 LAB — T3, FREE: T3, Free: 2.8 pg/mL (ref 2.3–4.2)

## 2018-05-07 ENCOUNTER — Encounter (INDEPENDENT_AMBULATORY_CARE_PROVIDER_SITE_OTHER): Payer: Self-pay | Admitting: *Deleted

## 2018-05-16 ENCOUNTER — Encounter (INDEPENDENT_AMBULATORY_CARE_PROVIDER_SITE_OTHER): Payer: Self-pay | Admitting: "Endocrinology

## 2018-05-31 ENCOUNTER — Other Ambulatory Visit (INDEPENDENT_AMBULATORY_CARE_PROVIDER_SITE_OTHER): Payer: Self-pay | Admitting: "Endocrinology

## 2018-05-31 ENCOUNTER — Encounter (INDEPENDENT_AMBULATORY_CARE_PROVIDER_SITE_OTHER): Payer: Self-pay | Admitting: *Deleted

## 2018-06-06 ENCOUNTER — Ambulatory Visit: Payer: BLUE CROSS/BLUE SHIELD | Admitting: Family Medicine

## 2018-06-07 ENCOUNTER — Encounter: Payer: Self-pay | Admitting: Allergy

## 2018-06-07 ENCOUNTER — Ambulatory Visit: Payer: BLUE CROSS/BLUE SHIELD | Admitting: Allergy

## 2018-06-07 VITALS — BP 125/75 | HR 65 | Temp 98.2°F | Resp 16 | Ht 61.0 in | Wt 184.0 lb

## 2018-06-07 DIAGNOSIS — J4530 Mild persistent asthma, uncomplicated: Secondary | ICD-10-CM | POA: Diagnosis not present

## 2018-06-07 DIAGNOSIS — J310 Chronic rhinitis: Secondary | ICD-10-CM

## 2018-06-07 MED ORDER — MONTELUKAST SODIUM 10 MG PO TABS: 10.0000 mg | ORAL_TABLET | Freq: Every day | ORAL | 5 refills | Status: DC

## 2018-06-07 NOTE — Progress Notes (Signed)
Follow-up Note  RE: Carolyn [name] MRN: [id number] DOB: 1991/08/15 Date of Office Visit: 06/07/2018   History of present illness: Carolyn [name] is a 27 y.o. female presenting today for follow-up of asthma and chronic rhinitis. She was last seen in the office on 09/30/16 by myself.  She states she has been doing well since her last visit without any major health changes, surgeries or hospitalizations.  She states she has not used albuterol in several years now and does not recall the last time she has been symptomatic requiring use of albuterol.  She denies any nighttime awakenings, ED/UC visits or oral steroid needs.  She does report taking singulair daily.   With her rhinitis she takes zyrtec daily which she states controls here nasal symptoms.   She also takes prilosec daily as well as multivitamins.    Review of systems: Review of Systems  Constitutional: Negative for chills, fever and malaise/fatigue.  HENT: Negative for congestion, ear discharge, nosebleeds and sore throat.   Eyes: Negative for pain, discharge and redness.  Respiratory: Negative for cough, shortness of breath and wheezing.   Cardiovascular: Negative for chest pain.  Gastrointestinal: Negative for abdominal pain, constipation, diarrhea, heartburn, nausea and vomiting.  Musculoskeletal: Negative for joint pain.  Skin: Negative for itching and rash.  Neurological: Negative for headaches.    All other systems negative unless noted above in HPI  Past medical/social/surgical/family history have been reviewed and are unchanged unless specifically indicated below.  No changes  Medication List: Allergies as of 06/07/2018      Reactions   Clarithromycin Hives   Codeine Other (See Comments)   Unsure of reaction type   Morphine And Related Nausea And Vomiting   Penicillins    Has patient had a PCN reaction causing immediate rash, facial/tongue/throat swelling, SOB or lightheadedness with hypotension:  Unknown Has patient had a PCN reaction causing severe rash involving mucus membranes or skin necrosis: Unknown Has patient had a PCN reaction that required hospitalization: No Has patient had a PCN reaction occurring within the last 10 years: No If all of the above answers are "NO", then may proceed with Cephalosporin use.   Omnicef [cefdinir] Rash      Medication List        Accurate as of 06/07/18  4:50 PM. Always use your most recent med list.          albuterol 108 (90 Base) MCG/ACT inhaler Commonly known as:  PROVENTIL HFA;VENTOLIN HFA Inhale 2 puffs into the lungs every 4 (four) hours as needed for wheezing or shortness of breath.   albuterol (2.5 MG/3ML) 0.083% nebulizer solution Commonly known as:  PROVENTIL Take 2.5 mg by nebulization every 6 (six) hours as needed (FOR WHEEZING/SHORTNESS OF BREATH.).   CALCIUM PO Calcium 500 + D  daily   cetirizine 10 MG tablet Commonly known as:  ZYRTEC Take one tablet daily for runny nose or itching.   estradiol 0.1 MG/24HR patch Commonly known as:  VIVELLE-DOT PLACE 1 PATCH ONTO THE SKIN 2 (TWO) TIMES A WEEK.   ibuprofen 600 MG tablet Commonly known as:  ADVIL,MOTRIN Take 1 tablet (600 mg total) by mouth every 6 (six) hours as needed.   levothyroxine 100 MCG tablet Commonly known as:  SYNTHROID, LEVOTHROID TAKE ONE TABLET BY MOUTH EVERY DAY.   medroxyPROGESTERone 10 MG tablet Commonly known as:  PROVERA Take 10 mg by mouth See admin instructions. TAKE 1 TABLET EVERY DAY FOR 14 DAYS OUT THE MONTH  montelukast 10 MG tablet Commonly known as:  SINGULAIR TAKE ONE TABLET EACH EVENING AT BEDTIME TO PREVENT COUGH OR WHEEZE.   omeprazole 20 MG capsule Commonly known as:  PRILOSEC TAKE 1 CAPSULE (20 MG TOTAL) BY MOUTH 2 (TWO) TIMES DAILY.   VITAMIN D PO Vitamin D  weekly       Known medication allergies: Allergies  Allergen Reactions  . Clarithromycin Hives  . Codeine Other (See Comments)    Unsure of reaction  type  . Morphine And Related Nausea And Vomiting  . Penicillins     Has patient had a PCN reaction causing immediate rash, facial/tongue/throat swelling, SOB or lightheadedness with hypotension: Unknown Has patient had a PCN reaction causing severe rash involving mucus membranes or skin necrosis: Unknown Has patient had a PCN reaction that required hospitalization: No Has patient had a PCN reaction occurring within the last 10 years: No If all of the above answers are "NO", then may proceed with Cephalosporin use.   [name] [Cefdinir] Rash     Physical examination: Blood pressure 125/75, pulse 65, temperature 98.2 F (36.8 C), resp. rate 16, height 5\' 1"  (1.549 m), weight 184 lb (83.5 kg), SpO2 98 %.  General: Alert, interactive, in no acute distress. HEENT: PERRLA, TMs pearly gray, turbinates minimally edematous without discharge, post-pharynx non erythematous. Neck: Supple without lymphadenopathy. Lungs: Clear to auscultation without wheezing, rhonchi or rales. {no increased work of breathing. CV: Normal S1, S2 without murmurs. Abdomen: Nondistended, nontender. Skin: Warm and dry, without lesions or rashes. Extremities:  No clubbing, cyanosis or edema. Neuro:   Grossly intact.  Diagnositics/Labs:  Spirometry: FEV1: 1.73L 58%, FVC: 1.97L 57%, ratio consistent with restrictive pattern.     Assessment and plan: Mild persistent asthma, currently asymptomatic  -  Continue Singulair daily.  - ProAir respiclick 2 puffs or albuterol 1 vial via nebulizer every 4 hours as needed.  - Monitor frequency of albuterol use if needed  Chronic rhinitis, well controlled  - Zyrtec 10mg  once daily as needed.  Follow-up in 6-12 month or sooner if needed.  I appreciate the opportunity to take part in Carolyn [name]'s care. Please do not hesitate to contact me with questions.  Sincerely,   Margo Aye, MD Allergy/Immunology [facility]

## 2018-06-07 NOTE — Patient Instructions (Signed)
   Continue Singulair daily.  Zyrtec 10mg  once daily as needed.  ProAir respiclick 2 puffs or albuterol 1 vial via nebulizer every 4 hours as needed.  Call with any ProAir/nebulizer use.  Follow-up in 1 year or sooner if needed.

## 2018-08-03 LAB — PTH, INTACT AND CALCIUM
Calcium: 8.9 mg/dL (ref 8.6–10.2)
PTH: 35 pg/mL (ref 14–64)

## 2018-08-03 LAB — T3, FREE: T3, Free: 3.2 pg/mL (ref 2.3–4.2)

## 2018-08-03 LAB — TSH: TSH: 2.13 mIU/L

## 2018-08-03 LAB — T4, FREE: Free T4: 1.5 ng/dL (ref 0.8–1.8)

## 2018-08-03 LAB — VITAMIN D 25 HYDROXY (VIT D DEFICIENCY, FRACTURES): Vit D, 25-Hydroxy: 29 ng/mL — ABNORMAL LOW (ref 30–100)

## 2018-08-04 ENCOUNTER — Other Ambulatory Visit (INDEPENDENT_AMBULATORY_CARE_PROVIDER_SITE_OTHER): Payer: Self-pay | Admitting: "Endocrinology

## 2018-08-04 DIAGNOSIS — R1013 Epigastric pain: Secondary | ICD-10-CM

## 2018-08-16 ENCOUNTER — Ambulatory Visit (INDEPENDENT_AMBULATORY_CARE_PROVIDER_SITE_OTHER): Payer: BLUE CROSS/BLUE SHIELD | Admitting: "Endocrinology

## 2018-08-16 ENCOUNTER — Encounter (INDEPENDENT_AMBULATORY_CARE_PROVIDER_SITE_OTHER): Payer: Self-pay | Admitting: "Endocrinology

## 2018-08-16 VITALS — BP 118/74 | HR 96 | Ht 61.26 in | Wt 178.0 lb

## 2018-08-16 DIAGNOSIS — E049 Nontoxic goiter, unspecified: Secondary | ICD-10-CM | POA: Diagnosis not present

## 2018-08-16 DIAGNOSIS — E063 Autoimmune thyroiditis: Secondary | ICD-10-CM

## 2018-08-16 DIAGNOSIS — I10 Essential (primary) hypertension: Secondary | ICD-10-CM

## 2018-08-16 DIAGNOSIS — E559 Vitamin D deficiency, unspecified: Secondary | ICD-10-CM

## 2018-08-16 DIAGNOSIS — Q51811 Hypoplasia of uterus: Secondary | ICD-10-CM

## 2018-08-16 DIAGNOSIS — K219 Gastro-esophageal reflux disease without esophagitis: Secondary | ICD-10-CM

## 2018-08-16 DIAGNOSIS — R7303 Prediabetes: Secondary | ICD-10-CM | POA: Diagnosis not present

## 2018-08-16 DIAGNOSIS — N910 Primary amenorrhea: Secondary | ICD-10-CM

## 2018-08-16 LAB — POCT GLUCOSE (DEVICE FOR HOME USE): Glucose Fasting, POC: 102 mg/dL — AB (ref 70–99)

## 2018-08-16 LAB — POCT GLYCOSYLATED HEMOGLOBIN (HGB A1C): Hemoglobin A1C: 5.5 % (ref 4.0–5.6)

## 2018-08-16 NOTE — Progress Notes (Signed)
Chief complaint: Follow-up of primary amenorrhea, congenital hypoplastic uterus, acquired autoimmune hypothyroidism, Hashimoto's thyroiditis, goiter, kyphosis, scoliosis, osteopenia, s/p repair of double outlet right ventricle, and gastroesophageal reflux disease  History of present illness: The patient is a 27 year-old Caucasian young woman. She was unaccompanied.  1. We have followed this young woman since 10/28/2010 when she was referred by Dr. Crawford Givens, [facility] Gynecology, for evaluation of hypothyroidism and primary amenorrhea.   A. At age 15 months the child was noted to be somewhat slow to walk. She was referred to a pediatric neurologist, Dr. Wyline Copas, for further evaluation. Dr. Gaynell Face noted some minor gross motor and fine motor delays, but also noted a significant cardiac murmur. 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 After several years of follow-up, the patient underwent corrective surgery in 2009. She has done well clinically since that time.  B. 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 The geneticist noted the presence of a scoliosis and some limb length discrepancy. Briselda had flattening of the left occipital region as compared to the right. Her scalp hair was thin, especially sparse in the temporal regions. The forehead was flat. She had short palpebral fissures and a wide nasal bridge. Epicanthal folds were present. She had a thin upper lip and some underbite. She also had facial asymmetry with the right cheek being more prominent than the left. The lower lip was also more prominent on the right than on the left. The neck was short and wide. The nipples were felt to be widely spaced. Labia majora were slightly  hypoplastic. She had mild soft tissue syndactyly between the third and fourth digits bilaterally. The right leg was longer than the left.  The right thigh was somewhat longer than the left thigh. The right foot was longer than the left foot. All of the right toes were larger than the left toes. Left nails were smaller and dystrophic. She also had a thoracolumbar scoliosis with convexity to the right. She walked with a tilt of her pelvis to the left side. 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 Because the clinical findings suggested the possibility of a genetic disorder, a karyotype was obtained. She had a 47, XX karyotype. A FISH study was also performed. This study showed no evidence for the common form of DiGeorge Syndrome. The geneticist was not able to make a specific genetic diagnosis. Although the geneticist requested that the family return for a follow-up examination, it appears that did not happen.  C. On 09/03/10 the patient was evaluated at [facility]. The reason for referral was primary amenorrhea at age 7. The patient's breasts were noted to be Tanner stage III. The vagina looked normal externally. The patient was unable to tolerate either a speculum exam or pelvic exam. Pregnancy test was negative. Laboratory data showed a TSH of 9.296, free T4 0.91, and T3 of 126.5. Her TSH was definitely elevated. The free T4 and T3 were in the lower portion of the normal range. FSH was very elevated at  92.5. Prolactin was 4.2. Repeat karyotype was 15, XX. A pelvic ultrasound was performed in the [facility] office. The uterus and ovaries were not clearly seen. Endometrial stripe was not seen. It  was commented that additional imaging may be necessary.  D. On 10/28/10 the patient was seen by our former physician assistant, who obtained additional history to include past issues with allergies, asthma, and gastroesophageal  reflux disease. At that point the patient was taking Singulair daily, Advair daily, omeprazole daily, and cetirizine (Zyrtec) as needed. On examination the patient weighed 148 pounds which was at the 80th percentile. Her height was at the 8th percentile. Her blood pressure was 124/80 and heart rate was 68. She did have a somewhat dysmorphic facies. A mildly enlarged, but diffusely enlarged, nontender goiter was noted. Laboratory data showed a TSH of 7.517, free T4 of 1.02, and free T3 of 3.4. FSH was 90 and LH was 35. Testosterone was 23.56. Estradiol was 17.2. She definitely had both primary hypogonadism and primary hypothyroidism. At that point she was started on Synthroid, 25 mcg per day.  2. Upon our physician assistant's departure at the end of March 2012, the patient was rescheduled to see me on 01/31/2011. She was taking her Synthroid 25, mcg per day at that time. Her goiter was approximately 20-25 grams in size. The thyroid was non-tender. She was still amenorrheic. Thyroid tests done on that day showed a TSH of 1.667, free T4  1.26, and free T3 3.5. These tests were mid-range normal. Her TPO antibody was elevated at 299, c/w Hashimoto's thyroiditis. She was trying to follow our  Eat Right Diet plan. She was also walking at least an hour per day. She had lost 16 pounds. At her visit on 08/30/11, I met with the patient and her father.The patient was then taking Synthroid, 37.5 mcg/day.  I learned that her mother was not attending the visits in our clinic on the building's third floor because the mother was very afraid of heights and elevators. I made arrangements to see the patient and parents in the first floor radiology conference room on 09/13/11. Dr. Janeal Holmes, MD, PhD, our staff geneticist, joined me on that visit and we provided education to the family about possible Turner's Syndrome mosaicism, hypothyroidism, and Hashimoto's diease.   3. On 03/15/12 Dr. Abelina Bachelor and I contacted Dr. Freddy Jaksch, pediatric endocrinologist at [facility] and a nationally recognized expert on Turner's Syndrome. We presented Halina's case to Dr. Melina Fiddler. Although Dr. Melina Fiddler agreed that Ander Purpura likely had a genetic basis for her complex congenital heart disease and other issues, Dr. Melina Fiddler did not feel that Catharina had Turner's mosaicism, but Dr, Melina Fiddler could not identify any other recognized syndrome that would fit Chamara. When I asked Dr. Melina Fiddler her opinion as to whether or not we needed to surgically define the gonads before beginning hormone replacement therapy, Dr. Melina Fiddler stated that we probably did not need to do so, but she did understand my concern that if an ovotestis or ovotestes were present, it/they should be removed due to the risk of possible later development of gonadal cancer. Dr. Melina Fiddler suggested that when we were ready to start hormone replacement therapy we use Vivelle-Dot transdermal estradiol, beginning with a dose of 25 mcg/day and gradually increasing the dose to 100 mcg/day over 1-2 years time. We should then wait for spontaneous occurrence of menses.  We could then add a progestin, such as Prometrium, on days 1-10 of each month.     4. In the succeeding six years we have dealt with several different issues:  A. Hypothyroidism secondary to Hashimoto's thyroiditis: Onalee's Hashimoto's disease T lymphocytes have gradually but progressively destroyed more thyrocytes. In response,  we have gradually increased the patient's Synthroid dosage to 100 mcg/day on 6-7 days per week in order to keep her euthyroid.   B. Primary amenorrhea: We have also been gradually increasing the strength of her Vivelle-Dot patches.  In September 2018 Dr. Charlesetta Garibaldi started Makaela on Provera, 10 mg/day for 14 days each month. Dorrie has had menses every month since then. Tully also underwent a D%C and hysteroscopy on 11/10/17. One vaginal polyp was noted. The endometrium was essentially acellular. Yulieth  says that Dr. Charlesetta Garibaldi told her that everything was fine.   C. Vitamin D deficiency: We have also adjusted her Citracal-D and Biotech vitamin D doses to optimize her bone mineral status.  5. The patient's last [facility] clinic visit was on 03/13/18. At her last visit I continued all of her medications. In the interim she has been healthy. After reviewing her lab results from 02/07/18 I increased her Synthroid to 100 mcg/day. However, after reviewing her lab results from July, I reduced the dose to 100 mcg/day for 6 days each week, but only 50 mcg/day for one day each week.   AAnder Purpura has not noticed any changes in breast size or breast tenderness.   B. She saw Dr. Charlesetta Garibaldi again on 04/04/18, Dr. Charlesetta Garibaldi continued her Provera for 14 days each month. Now Caela has regular periods following each cycle of Provera.   C. She remains on the mini-Vivelle 0.100 patch every 3-4 days; Provera, 10 mg/day for 14 days each month; Synthroid 100 mcg/day for 6 days each week, but only 50 mcg/day on the 7th days; omeprazole, 20 mg, twice daily; and Citracal-D twice daily. She also takes Biotech form of vitamin D, 50,000 IU per week.   D. She is eating about the same.   6. Pertinent Review of Systems: Constitutional: The patient feels "pretty good". She is healthy and has no significant complaints. Eyes: Vision is good. There are no significant eye complaints. Neck: The patient has no complaints of anterior neck swelling, soreness, tenderness,  pressure, discomfort, or difficulty swallowing.  Heart: The patient has no complaints of palpitations, irregular heat beats, chest pain, or chest pressure. She saw her [facility] pediatric cardiologist, Dr Ermalene Searing, in mid-October 2019. He gave hear a clean bill of health. She no longer needs cardiology follow up.  Gastrointestinal: Bowel movents seem normal. The patient has no complaints of excessive hunger, acid reflux, upset stomach, stomach aches or pains, diarrhea, or  constipation. Legs: Muscle mass and strength seem normal. There are no complaints of numbness, tingling, burning, or pain. No edema is noted. Feet: There are no obvious foot problems. There are no complaints of numbness, tingling, burning, or pain. No edema is noted. Hair: She says that her scalp hair seems to be fine. She feels that she is producing and losing about equal amounts of scalp hair.     PAST MEDICAL, FAMILY, AND SOCIAL HISTORY:  1. School and Family: She has a new job at [employer] for 4-5 hours, 4 days per week. Dad and both paternal grandparents had thin hair. Her mother died of metastatic lung CA in Dec 25, 2015. 2. Activities: She walks a lot at work, but has reduced her walking for exercise a lot.    3. Tobacco, alcohol, and illicit drugs: None 4. PCP: Dr. Saddie Benders 5. GYN: Dr. Gwynneth Munson Dillard 6. Genetics: Dr. Janeal Holmes  REVIEW OF SYSTEMS: There are no other significant problems involving her other body systems.  PHYSICAL EXAM: BP 118/74  Pulse 96   Ht 5' 1.26" (1.556 m)   Wt 178 lb (80.7 kg)   LMP 08/10/2018 (Within Days)   BMI 33.35 kg/m   Constitutional: The patient looks good today. She is letting her hair grow out more now. She was again interested in our discussions. She interacted better, initiated conversation, and gave appropriate, but brief answers. Her affect and insight appear to be fairly normal. She has gained 9 pounds since her last visit.  Face: Her facies remain asymmetric/dysmorphic. The mouth is somewhat twisted rotationally, as before.  Eyes: There is no arcus or proptosis.  Mouth: The oropharynx appears normal. The tongue appears normal. There is normal oral moisture. There is no obvious gingivitis. Neck: There are no bruits present. The thyroid gland is low-lying.The thyroid gland is again about 21+ grams in size. The right lobe is mildly enlarged today. The left lobe is larger than the right, but smaller than at her last  visit. The consistency of the thyroid gland is normal. There is no thyroid tenderness to palpation. Lungs: The lungs are clear. Air movement is good. Heart: The heart rhythm and rate appear normal. Heart sounds S1 and S2 are normal. I do not appreciate any pathologic heart murmurs.  Abdomen: The abdomen is still enlarged today. Bowel sounds are normal. The abdomen is soft and non-tender. There is no obviously palpable hepatomegaly, splenomegaly, or other masses.  Arms: Muscle mass appears appropriate for age.  Hands: There is no obvious tremor. Phalangeal and metacarpophalangeal joints appear normal. Palms are normal. Legs: Muscle mass appears appropriate for age. There is no edema.  Neurologic: Muscle strength is normal for age and gender  in both the upper and the lower extremities. Muscle tone appears normal. Sensation to touch is normal in the legs. Hair: Her hair is thicker than it used to be, c/w estrogen effect. There are no bare spots, but the hair is visibly less dense along the part in her hair and at the vertex.  Laboratory data:     Labs 08/16/18: HbA1c 5.5%, CBG 102  Labs 08/02/18: TSH 2.13, free T4 1.5, free T3 3.2; PTH 35, calcium 8.9, 25-OH vitamin D 29  Labs 05/02/18: TSH 0.27, free T4 1.5, free T3 2.8  Labs 03/13/18: HbA1c 5.4%;   Labs 02/07/18: TSH 5.41, free T4 1.5, free T3 2.9; 25-OH vitamin D 33, PTH 34, calcium 9.2  Labs 11/09/17: HbA1c 5.5%, CBG 96  Labs 11/03/17: CBC Hgb 9.8, Hct 31.4, MCV 68.9, MCH 21.5  Labs 08/15/17: TSH 0.22, free T4 2.1, free T3 3.3; 1, 25-OH vitamin D 53 ; LH 0.5, FSH 4.2, estradiol 79, testosterone 15; CMP normal  Labs 04/25/17: HbA1c 5.5%, CBG 92  Labs 04/12/17: TSH 0.65, free T4 1.8, free T3 3.3; LH 4.4, FSH 4.4, estradiol 76  Labs 12/19/16: HbA1c 5.3%, CBG 95  Labs 12/06/16: LH 4.0, FSH 5.5, estradiol 135, testosterone 19, free testosterone 2.3; TSH  0.29, free T4 1.7, free T3 3.1; PTH 38, calcium 9.1, vitamin D 53  Labs 08/18/16: HbA1c  5.7%  Labs 08/08/16: TSH 0.78, free T4 1.6, free T3 3.1; LH 3.1, FSH 5.8, estradiol 33, testosterone 16; PTH 25, calcium 9.0, 25-OH vitamin D 56  Labs 05/18/16: HbA1c 5.6%  Labs 04/25/16: TSH 2.20, free T4 1.6, free T3 2.8; PTH 30, calcium 9.1, 25-OH vitamin D 67;  estradiol 92  Labs 01/04/16: HbA1c 5.5%  Labs 12/28/15: TSH 0.83, free T4 1.7, free T3 3.0; PTH 54, calcium 9.2, 25-OH vitamin D 21;  CMP normal; LH 2.0, FSH 6.3, estradiol 119,  testosterone  43, free testosterone 8.5  08/17/15: HbA1c 5.6%  Labs 08/10/15: TSH 0.942, free T4 1.49, free T3 2.7; LH 2.3, FSH 13.1, testosterone 69, estradiol 71.0; PTH 44, calcium 9.3, 25-OH vitamin D 19  Labs 04/02/15: HbA1c was 6.0%. PTH 36, calcium 9.5, 25-OH vitamin D 20; LH 6.6, FSH 13, estradiol 84.8, testosterone 37; TSH 2.152, free T4 1.31, free T3 2.9  Labs 11/24/14: HbA1c was 5.7% today, compared with 6.1% at last visit and with 5.8% at the visit prior; calcium 9.2, PTH 49; 25-OH vitamin D 22; TSH 0.449, free T4 1.54, free T3 3.5; testosterone 41, estradiol 34.4  Labs 07/21/14: CMP normal; estradiol 30.2, FSH 64.1, LH 30.1, testosterone 41; TSH 3.596, free T4 1.75, free T3 3.3; PTH 42, calcium 9.4, 215-hydroxy vitamin D 33  Labs 12/24/13: TSH 0.768, free T4 1.42, free T3 3.0; estradiol 52.3, FSH 26.6, LH 10.3, testosterone 46; CMP normal   Labs 08/07/13: BMP normal, with calcium 9.5; estradiol 22.7; FSH 46.7, LH 18.3; TSH 1.787, free T4 1.63, free T3 3.1; testosterone 43   Labs 05/03/13: Estradiol 17.5, testosterone 66, free testosterone 14.9, LH 44.8, FSH 91.4, 25-hydroxy vitamin D 32, 1,25-dihydroxy vitamin D 55  Labs 01/18/13: BMP normal, to include calcium of 9.9; TSH 1.156, free T4 1.51, free T3 3.1; testosterone 46 (increased from 33.05), estradiol 16.2 (increased from < 11.8); LH 34.4 (decreased from 36.5), FSH 86.3 (decreased from 106.1); PTH 36.8, phosphorus 4.0, 25-hydroxy vitamin D 27   Labs 10/12/12: TSH 2.079, free T4 1.44, free T3  2.9, LH 36.5,  FSH 106.2, estradiol <11.8, Testosterone 29.94, calcium 9.8, phosphorus 4.5, 25-vitamin D 29,   Labs 05/09/12: TSH 0.351, free T4 1.73, free T3 3.1   Labs 03/05/12: TSH 3.236, free T4 1.60, free T3 3.1   Labs 12/14/11: TSH 3.8, free T4 1.39, free T3 3.3, TPO 381, LH 33.4, FSH 86.9, testosterone 33.5, free testosterone 7.2 (normal 1.0-5.0)  Estradiol 13.6,                Labs 08/22/11: TSH 3.183, free T4 1.40, free T3 3.4. testosterone 34.45, estradiol 14.8     Buccal smear - normal  IMAGING:   MRI of pelvis 10/05/15: Uterus has divergent horns, but normal outer fundal contour. Associated fundal thickening is c/w septate uterus. Right ovary measures 11 x 8 mm and has a small simple cyst/follicle. Left ovary measures 11 x 8 mm aid is poorly evaluated but grossly unremarkable.   US pelvis 08/27/15: Uterus measured 9.1 x 3.2 x 4.8 cm. Endometrial thickness was 14 mm. Right ovary was not well visualized. Left ovary measured 2.2 x 2.0 x 2.1 cm. The left ovary appeared normal. There was a non-specific hypoechoic area within the right adnexal region that might be secondary to overlying bowel structures.  BMD 05/29/13: BMD in spine was > 50%. BMD in the hip was above the 95%. Z-scores were 0.3 and 1.68 respectively.   ASSESSMENT:  1. Hypothyroidism: The patient was mildly hypothyroid in April 2019, so I increased her Synthroid dose accordingly. In July she was hyperthyroid, so I reduced the dose. In October she is euthyroid. Her TSH is slightly above the goal range of 1.0-2.0, but is acceptable for now.   2. Hashimoto's thyroiditis: Her Hashimoto's disease is clinically quiescent, but intermittently active. The pattern of all three TFTs shifting in parallel together in the same direction, upward or downward, that occurred between October 2014 and March 2015,  is pathognomonic for having had an interim flare up of Hashimoto's thyroiditis.  3. Goiter: The thyroid gland is about the same size  today, but the lobes have shifted in size again. The waxing and waning of thyroid gland size is also c/w evolving Hashimoto's disease.  4. Primary Amenorrhea/Congenital small (hypoplastic) uterus:   A. When she became euthyroid, she had increases in [facility] and [place] into the menopausal range. Since initiating estradiol therapy, however, her LH and FSH had decreased and her testosterone and estradiol had increased. Some of these increases may have been due to increased ovarian production of these hormones. Some of the increased testosterone may have been caused by the effects of overly fat adipose cell cytokines causing insulin resistance and hyperinsulinemia, the hyperinsulinemia in turn causing elevated testosterone. Some of the increased estradiol may have been due to increased aromatization of testosterone by adipose cells.   B. When we began estrogen replacement therapy, our immediate estrogen replacement goals were three-fold:   1. To promote estrogenization of breasts and vaginal tissues   2. To promote feminization of her brain and body overall   3. To improve bone mineral density   C. Margene has responded well to [place]. When she saw Dr. Charlesetta Garibaldi, Dr. Charlesetta Garibaldi started Provera for 14 days each month, resulting in monthly periods for Kendrick.  5. Obesity: Patient's weight has increased again. Since re-joining the work force, she has significantly reduced her exercise walking. She needs to resume exercise walking, reduce her caloric intake, and burn off more calories than she takes in.   6. Developmental delays: As noted in my November 2012 note, the patient had an IQ test which gave her an IQ of 29. In many prior visits her interactions were c/w that IQ or perhaps a somewhat lower IQ.  At her last several visits and again at today's visit she was more engaged and acted quite normally.  7. GERD: Her GERD and dyspepsia are well-controlled with omeprazole. At this point she shows no signs of tachyphylaxis.   8. Kyphosis: Her repeat scoliosis series did not show progression. In fact, there was a suggestion of improvement. 9. Hypertension: Her BP is lower today, but the DBP is still mildly elevated. She needs to walk regularly. 10. Pre-diabetes: Her HbA1c is within normal limits, but higher, paralleling her increase in weight.  11. Vitamin D deficiency disease: Her vitamin D level in October 2018 was good, c/w her taking her Biotech regularly. Her vitamin D level in April 2019 was lower and is low today. Her calcium is also lower. She has been missing some vitamin D and calcium doses.   PLAN: 1. Diagnostic: Repeat TFTs, vitamin D, PTH, and calcium in 3 months and 6 months. 2. Therapeutic: Continue the mini-Vivelle at 0.100 mg patch every 3-4 days and Provera, 10 mg daily for 14 days each month, or change doses, as directed by Dr. Charlesetta Garibaldi.  Continue omeprazole, 20 mg, twice daily. Take one Citracal-D, twice daily, at lunch and dinner.  Continue Biotech one 50,00 IU capsule once a week. Add 1-2 Tums at bedtime. Continue the Synthroid dose of 100 mcg/day for 6 days each week, but take only 1/2 pill on the 7th days.  3. Patient education: We again discussed the fact that we started ERT in order to promote feminization and to achieve as much bone mineralization as possible. We still do not know if the uterus will grow enough for her to be a candidate for in vitro fertilization. We discussed  optimizing her calcium-vitamin D status.  4. Follow-up: Follow-up visit in 6 months.  Level of Service: This visit lasted in excess of 55 minutes. More than 50% of the visit was devoted to counseling.  Tillman Sers, MD, CDE Adult and Pediatric Endocrinology

## 2018-08-16 NOTE — Patient Instructions (Signed)
Follow up visit in 6 months. Please repeat lab tests in January and again 1-2 weeks before your next visit.

## 2018-09-06 ENCOUNTER — Other Ambulatory Visit (INDEPENDENT_AMBULATORY_CARE_PROVIDER_SITE_OTHER): Payer: Self-pay | Admitting: "Endocrinology

## 2018-09-06 DIAGNOSIS — E063 Autoimmune thyroiditis: Secondary | ICD-10-CM

## 2018-11-06 ENCOUNTER — Other Ambulatory Visit (INDEPENDENT_AMBULATORY_CARE_PROVIDER_SITE_OTHER): Payer: Self-pay | Admitting: "Endocrinology

## 2018-11-06 DIAGNOSIS — N912 Amenorrhea, unspecified: Secondary | ICD-10-CM

## 2018-12-04 ENCOUNTER — Telehealth: Payer: Self-pay | Admitting: Allergy

## 2018-12-04 DIAGNOSIS — J4530 Mild persistent asthma, uncomplicated: Secondary | ICD-10-CM

## 2018-12-04 MED ORDER — MONTELUKAST SODIUM 10 MG PO TABS: 10.0000 mg | ORAL_TABLET | Freq: Every day | ORAL | 5 refills | Status: DC

## 2018-12-04 NOTE — Telephone Encounter (Signed)
Pt called and needs to have singulair called into [address]. [phone].

## 2018-12-04 NOTE — Telephone Encounter (Signed)
Script sent into pharmacy 

## 2018-12-17 LAB — VITAMIN D 25 HYDROXY (VIT D DEFICIENCY, FRACTURES): Vit D, 25-Hydroxy: 26 ng/mL — ABNORMAL LOW (ref 30–100)

## 2018-12-17 LAB — PTH, INTACT AND CALCIUM
Calcium: 9.2 mg/dL (ref 8.6–10.2)
PTH: 34 pg/mL (ref 14–64)

## 2018-12-17 LAB — T3, FREE: T3, Free: 3.1 pg/mL (ref 2.3–4.2)

## 2018-12-17 LAB — T4, FREE: Free T4: 1.6 ng/dL (ref 0.8–1.8)

## 2018-12-17 LAB — TSH: TSH: 2.66 mIU/L

## 2018-12-19 ENCOUNTER — Encounter (INDEPENDENT_AMBULATORY_CARE_PROVIDER_SITE_OTHER): Payer: Self-pay

## 2019-01-03 ENCOUNTER — Encounter (INDEPENDENT_AMBULATORY_CARE_PROVIDER_SITE_OTHER): Payer: Self-pay

## 2019-02-20 ENCOUNTER — Ambulatory Visit (INDEPENDENT_AMBULATORY_CARE_PROVIDER_SITE_OTHER): Payer: BLUE CROSS/BLUE SHIELD | Admitting: "Endocrinology

## 2019-02-21 ENCOUNTER — Encounter (INDEPENDENT_AMBULATORY_CARE_PROVIDER_SITE_OTHER): Payer: Self-pay

## 2019-02-28 ENCOUNTER — Other Ambulatory Visit (INDEPENDENT_AMBULATORY_CARE_PROVIDER_SITE_OTHER): Payer: Self-pay | Admitting: "Endocrinology

## 2019-02-28 DIAGNOSIS — E063 Autoimmune thyroiditis: Secondary | ICD-10-CM

## 2019-04-02 ENCOUNTER — Ambulatory Visit (INDEPENDENT_AMBULATORY_CARE_PROVIDER_SITE_OTHER): Payer: BLUE CROSS/BLUE SHIELD | Admitting: "Endocrinology

## 2019-04-02 LAB — T4, FREE: Free T4: 1.7 ng/dL (ref 0.8–1.8)

## 2019-04-02 LAB — PTH, INTACT AND CALCIUM
Calcium: 9.2 mg/dL (ref 8.6–10.2)
PTH: 25 pg/mL (ref 14–64)

## 2019-04-02 LAB — VITAMIN D 25 HYDROXY (VIT D DEFICIENCY, FRACTURES): Vit D, 25-Hydroxy: 41 ng/mL (ref 30–100)

## 2019-04-02 LAB — T3, FREE: T3, Free: 2.9 pg/mL (ref 2.3–4.2)

## 2019-04-02 LAB — TSH: TSH: 0.22 mIU/L — ABNORMAL LOW

## 2019-04-10 ENCOUNTER — Other Ambulatory Visit: Payer: Self-pay

## 2019-04-10 ENCOUNTER — Ambulatory Visit (INDEPENDENT_AMBULATORY_CARE_PROVIDER_SITE_OTHER): Payer: BC Managed Care – PPO | Admitting: "Endocrinology

## 2019-04-10 DIAGNOSIS — N910 Primary amenorrhea: Secondary | ICD-10-CM

## 2019-04-10 DIAGNOSIS — E063 Autoimmune thyroiditis: Secondary | ICD-10-CM | POA: Diagnosis not present

## 2019-04-10 DIAGNOSIS — Q51811 Hypoplasia of uterus: Secondary | ICD-10-CM

## 2019-04-10 DIAGNOSIS — K219 Gastro-esophageal reflux disease without esophagitis: Secondary | ICD-10-CM

## 2019-04-10 DIAGNOSIS — E559 Vitamin D deficiency, unspecified: Secondary | ICD-10-CM

## 2019-04-10 DIAGNOSIS — R7303 Prediabetes: Secondary | ICD-10-CM

## 2019-04-10 NOTE — Progress Notes (Signed)
Chief complaint: The patient presents for her televisit today for follow-up of primary amenorrhea, congenital hypoplastic uterus, acquired autoimmune hypothyroidism, Hashimoto's thyroiditis, goiter, kyphosis, scoliosis, osteopenia, s/p repair of double outlet right ventricle, and gastroesophageal reflux disease  History of present illness: The patient is a 28 year-old Caucasian young woman. She was unaccompanied.  1. We have followed this young woman since 10/28/2010 when she was referred by Dr. Crawford Givens, [facility] and Gynecology, for evaluation of hypothyroidism and primary amenorrhea.   A. At age 16 months the child was noted to be somewhat slow to walk. She was referred to a pediatric neurologist, Dr. Wyline Copas, for further evaluation. Dr. Gaynell Face noted some minor gross motor and fine motor delays, but also noted a significant cardiac murmur. 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 After several years of follow-up, the patient underwent corrective surgery in 2009. She has done well clinically since that time.  B. 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 The geneticist noted the presence of a scoliosis and some limb length discrepancy. Teairra had flattening of the left occipital region as compared to the right. Her scalp hair was thin, especially sparse in the temporal regions. The forehead was flat. She had short palpebral fissures and a wide nasal bridge. Epicanthal folds were present. She had a thin upper lip and some underbite. She also had facial asymmetry with the right cheek being more prominent than the left. The lower lip was also more prominent on the right than on the left. The neck was short and wide. The nipples were felt  to be widely spaced. Labia majora were slightly hypoplastic. She had mild soft tissue syndactyly between the third and fourth digits bilaterally. The right leg was longer than the left.  The right thigh was somewhat longer than the left thigh. The right foot was longer than the left foot. All of the right toes were larger than the left toes. Left nails were smaller and dystrophic. She also had a thoracolumbar scoliosis with convexity to the right. She walked with a tilt of her pelvis to the left side. 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 Because the clinical findings suggested the possibility of a genetic disorder, a karyotype was obtained. She had a 50, XX karyotype. A FISH study was also performed. This study showed no evidence for the common form of DiGeorge Syndrome. The geneticist was not able to make a specific genetic diagnosis. Although the geneticist requested that the family return for a follow-up examination, it appears that did not happen.  C. On 09/03/10 the patient was evaluated at [facility]. The reason for referral was primary amenorrhea at age 89. The patient's breasts were noted to be Tanner stage III. The vagina looked normal externally. The patient was unable to tolerate either a speculum exam or pelvic exam. Pregnancy test was negative. Laboratory data showed a TSH of 9.296, free T4 0.91, and T3 of 126.5. Her TSH was definitely elevated. The free T4 and T3 were in the lower portion of the normal range. FSH was very elevated at  92.5. Prolactin was 4.2. Repeat karyotype was 20, XX. A pelvic ultrasound was performed in the [facility] office. The uterus and ovaries were not  clearly seen. Endometrial stripe was not seen. It was commented that additional imaging may be necessary.  D. On 10/28/10 the patient was seen by our former physician assistant, who obtained additional history to include past issues  with allergies, asthma, and gastroesophageal reflux disease. At that point the patient was taking Singulair daily, Advair daily, omeprazole daily, and cetirizine (Zyrtec) as needed. On examination the patient weighed 148 pounds which was at the 80th percentile. Her height was at the 8th percentile. Her blood pressure was 124/80 and heart rate was 68. She did have a somewhat dysmorphic facies. A mildly enlarged, but diffusely enlarged, nontender goiter was noted. Laboratory data showed a TSH of 7.517, free T4 of 1.02, and free T3 of 3.4. FSH was 90 and LH was 35. Testosterone was 23.56. Estradiol was 17.2. She definitely had both primary hypogonadism and primary hypothyroidism. At that point she was started on Synthroid, 25 mcg per day.  2. Upon our physician assistant's departure at the end of March 2012, the patient was rescheduled to see me on 01/31/2011. She was taking her Synthroid 25, mcg per day at that time. Her goiter was approximately 20-25 grams in size. The thyroid was non-tender. She was still amenorrheic. Thyroid tests done on that day showed a TSH of 1.667, free T4  1.26, and free T3 3.5. These tests were mid-range normal. Her TPO antibody was elevated at 299, c/w Hashimoto's thyroiditis. She was trying to follow our  Eat Right Diet plan. She was also walking at least an hour per day. She had lost 16 pounds. At her visit on 08/30/11, I met with the patient and her father.The patient was then taking Synthroid, 37.5 mcg/day.  I learned that her mother was not attending the visits in our clinic on the building's third floor because the mother was very afraid of heights and elevators. I made arrangements to see the patient and parents in the first floor radiology conference room on 09/13/11. Dr. Janeal Holmes, MD, PhD, our staff geneticist, joined me on that visit and we provided education to the family about possible Turner's Syndrome mosaicism, hypothyroidism, and Hashimoto's diease.   3. On  03/15/12 Dr. Abelina Bachelor and I contacted Dr. Freddy Jaksch, pediatric endocrinologist at [facility] and a nationally recognized expert on Turner's Syndrome. We presented Jeannia's case to Dr. Melina Fiddler. Although Dr. Melina Fiddler agreed that Ander Purpura likely had a genetic basis for her complex congenital heart disease and other issues, Dr. Melina Fiddler did not feel that Claudina had Turner's mosaicism, but Dr, Melina Fiddler could not identify any other recognized syndrome that would fit Bhavya. When I asked Dr. Melina Fiddler her opinion as to whether or not we needed to surgically define the gonads before beginning hormone replacement therapy, Dr. Melina Fiddler stated that we probably did not need to do so, but she did understand my concern that if an ovotestis or ovotestes were present, it/they should be removed due to the risk of possible later development of gonadal cancer. Dr. Melina Fiddler suggested that when we were ready to start hormone replacement therapy we use Vivelle-Dot transdermal estradiol, beginning with a dose of 25 mcg/day and gradually increasing the dose to 100 mcg/day over 1-2 years time. We should then wait for spontaneous occurrence of menses.  We could then add a progestin, such as Prometrium, on days 1-10 of each month.     4. In the succeeding six years we have dealt with several different issues:  A. Hypothyroidism secondary to Hashimoto's thyroiditis: Irine's Hashimoto's disease T lymphocytes have  gradually but progressively destroyed more thyrocytes. In response, we have gradually increased the patient's Synthroid dosage to 100 mcg/day on 6-7 days per week in order to keep her euthyroid.   B. Primary amenorrhea: We have also been gradually increasing the strength of her Vivelle-Dot patches.  In September 2018 Dr. Charlesetta Garibaldi started Shirleyann on Provera, 10 mg/day for 14 days each month. Yun has had menses every month since then. Latashia also underwent a D%C and hysteroscopy on 11/10/17. One vaginal polyp was noted. The  endometrium was essentially acellular. Illyria says that Dr. Charlesetta Garibaldi told her that everything was fine.   C. Vitamin D deficiency: We have also adjusted her Citracal-D and Biotech vitamin D doses to optimize her bone mineral status.  5. The patient's last [facility] clinic visit was on 08/16/18. At her last visit I continued all of her medications. In the interim she has been healthy.  AAnder Purpura has not noticed any changes in breast size or breast tenderness.   B. She saw Dr. Charlesetta Garibaldi on 04/04/18, Dr. Charlesetta Garibaldi continued her Provera for 14 days each month. Now Meily has regular periods following each cycle of Provera. Leva will contact Dr. Berneta Sages office to find out when her next follow up visit is scheduled.  C. She remains on the mini-Vivelle 0.100 patch every 3-4 days; Provera, 10 mg/day for 14 days each month; Synthroid 100 mcg/day for 6 days each week, but only 50 mcg/day on the 7th days; omeprazole, 20 mg, twice daily; and Citracal-D twice daily. She also takes Biotech form of vitamin D, 50,000 IU per week.   D. She is eating about the same. She has been walking more.   6. Pertinent Review of Systems: Constitutional: Marquitta feels "pretty good". She is healthy and has no significant complaints. Eyes: Vision is good. There are no significant eye complaints. Neck: The patient has no complaints of anterior neck swelling, soreness, tenderness,  pressure, discomfort, or difficulty swallowing.  Heart: The patient has no complaints of palpitations, irregular heat beats, chest pain, or chest pressure. She saw her [facility] pediatric cardiologist, Dr Ermalene Searing, in mid-October 2019. He gave hear a clean bill of health. She no longer needs cardiology follow up.  Gastrointestinal: Bowel movents seem normal. The patient has no complaints of excessive hunger, acid reflux, upset stomach, stomach aches or pains, diarrhea, or constipation. Legs: Muscle mass and strength seem normal. There are no complaints of numbness,  tingling, burning, or pain. No edema is noted. Feet: There are no obvious foot problems. There are no complaints of numbness, tingling, burning, or pain. No edema is noted. Hair: She says that her scalp hair seems to be "pretty fine". She no longer feels that she is losing too much hair.      PAST MEDICAL, FAMILY, AND SOCIAL HISTORY:  1. School and Family: She decided to stop working at [employer]. She is looking for work now. Dad and both paternal grandparents had thin hair. Her mother died of metastatic lung CA in 24-Dec-2015. 2. Activities: She walks a lot.    3. Tobacco, alcohol, and illicit drugs: None 4. PCP: Dr. Saddie Benders 5. GYN: Dr. Gwynneth Munson Dillard 6. Genetics: Dr. Janeal Holmes  REVIEW OF SYSTEMS: There are no other significant problems involving her other body systems.  PHYSICAL EXAM: There were no vitals taken for this visit.  Constitutional: Jayra sounds alert and upbeat today. Her insight seems normal.    Laboratory data:     Labs 04/01/19: TSH 0.22, free T4  1.7, free T3 2.9; PTH 25, calcium 9.2, 25-OH vitamin D 41  Labs 12/14/18; TSH 2.66, free T4 1.6, free T3 3.1; PTH 34, calcium 9.2 25-OH vitamin D 26  Labs 08/16/18: HbA1c 5.5%, CBG 102  Labs 08/02/18: TSH 2.13, free T4 1.5, free T3 3.2; PTH 35, calcium 8.9, 25-OH vitamin D 29  Labs 05/02/18: TSH 0.27, free T4 1.5, free T3 2.8  Labs 03/13/18: HbA1c 5.4%;   Labs 02/07/18: TSH 5.41, free T4 1.5, free T3 2.9; 25-OH vitamin D 33, PTH 34, calcium 9.2  Labs 11/09/17: HbA1c 5.5%, CBG 96  Labs 11/03/17: CBC Hgb 9.8, Hct 31.4, MCV 68.9, MCH 21.5  Labs 08/15/17: TSH 0.22, free T4 2.1, free T3 3.3; 1, 25-OH vitamin D 53 ; LH 0.5, FSH 4.2, estradiol 79, testosterone 15; CMP normal  Labs 04/25/17: HbA1c 5.5%, CBG 92  Labs 04/12/17: TSH 0.65, free T4 1.8, free T3 3.3; LH 4.4, FSH 4.4, estradiol 76  Labs 12/19/16: HbA1c 5.3%, CBG 95  Labs 12/06/16: LH 4.0, FSH 5.5, estradiol 135, testosterone 19, free testosterone 2.3; TSH   0.29, free T4 1.7, free T3 3.1; PTH 38, calcium 9.1, vitamin D 53  Labs 08/18/16: HbA1c 5.7%  Labs 08/08/16: TSH 0.78, free T4 1.6, free T3 3.1; LH 3.1, FSH 5.8, estradiol 33, testosterone 16; PTH 25, calcium 9.0, 25-OH vitamin D 56  Labs 05/18/16: HbA1c 5.6%  Labs 04/25/16: TSH 2.20, free T4 1.6, free T3 2.8; PTH 30, calcium 9.1, 25-OH vitamin D 67;  estradiol 92  Labs 01/04/16: HbA1c 5.5%  Labs 12/28/15: TSH 0.83, free T4 1.7, free T3 3.0; PTH 54, calcium 9.2, 25-OH vitamin D 21; CMP normal; LH 2.0, FSH 6.3, estradiol 119,  testosterone  43, free testosterone 8.5  08/17/15: HbA1c 5.6%  Labs 08/10/15: TSH 0.942, free T4 1.49, free T3 2.7; LH 2.3, FSH 13.1, testosterone 69, estradiol 71.0; PTH 44, calcium 9.3, 25-OH vitamin D 19  Labs 04/02/15: HbA1c was 6.0%. PTH 36, calcium 9.5, 25-OH vitamin D 20; LH 6.6, FSH 13, estradiol 84.8, testosterone 37; TSH 2.152, free T4 1.31, free T3 2.9  Labs 11/24/14: HbA1c was 5.7% today, compared with 6.1% at last visit and with 5.8% at the visit prior; calcium 9.2, PTH 49; 25-OH vitamin D 22; TSH 0.449, free T4 1.54, free T3 3.5; testosterone 41, estradiol 34.4  Labs 07/21/14: CMP normal; estradiol 30.2, FSH 64.1, LH 30.1, testosterone 41; TSH 3.596, free T4 1.75, free T3 3.3; PTH 42, calcium 9.4, 215-hydroxy vitamin D 33  Labs 12/24/13: TSH 0.768, free T4 1.42, free T3 3.0; estradiol 52.3, FSH 26.6, LH 10.3, testosterone 46; CMP normal   Labs 08/07/13: BMP normal, with calcium 9.5; estradiol 22.7; FSH 46.7, LH 18.3; TSH 1.787, free T4 1.63, free T3 3.1; testosterone 43   Labs 05/03/13: Estradiol 17.5, testosterone 66, free testosterone 14.9, LH 44.8, FSH 91.4, 25-hydroxy vitamin D 32, 1,25-dihydroxy vitamin D 55  Labs 01/18/13: BMP normal, to include calcium of 9.9; TSH 1.156, free T4 1.51, free T3 3.1; testosterone 46 (increased from 33.05), estradiol 16.2 (increased from < 11.8); LH 34.4 (decreased from 36.5), FSH 86.3 (decreased from 106.1); PTH 36.8,  phosphorus 4.0, 25-hydroxy vitamin D 27   Labs 10/12/12: TSH 2.079, free T4 1.44, free T3 2.9, LH 36.5,  FSH 106.2, estradiol <11.8, Testosterone 29.94, calcium 9.8, phosphorus 4.5, 25-vitamin D 29,   Labs 05/09/12: TSH 0.351, free T4 1.73, free T3 3.1   Labs 03/05/12: TSH 3.236, free T4 1.60, free T3 3.1  Labs 12/14/11: TSH 3.8, free T4 1.39, free T3 3.3, TPO 381, LH 33.4, FSH 86.9, testosterone 33.5, free testosterone 7.2 (normal 1.0-5.0)  Estradiol 13.6,                Labs 08/22/11: TSH 3.183, free T4 1.40, free T3 3.4. testosterone 34.45, estradiol 14.8     Buccal smear - normal  IMAGING:   MRI of pelvis 10/05/15: Uterus has divergent horns, but normal outer fundal contour. Associated fundal thickening is c/w septate uterus. Right ovary measures 11 x 8 mm and has a small simple cyst/follicle. Left ovary measures 11 x 8 mm aid is poorly evaluated but grossly unremarkable.   US pelvis 08/27/15: Uterus measured 9.1 x 3.2 x 4.8 cm. Endometrial thickness was 14 mm. Right ovary was not well visualized. Left ovary measured 2.2 x 2.0 x 2.1 cm. The left ovary appeared normal. There was a non-specific hypoechoic area within the right adnexal region that might be secondary to overlying bowel structures.  BMD 05/29/13: BMD in spine was > 50%. BMD in the hip was above the 95%. Z-scores were 0.3 and 1.68 respectively.   ASSESSMENT:  1. Hypothyroidism:   A. The patient was mildly hypothyroid in April 2019, so I increased her Synthroid dose accordingly. In July she was hyperthyroid, so I reduced the dose. In October she was clinically and chemically euthyroid. In February 2020, however, her TSH was more above the goal range of 1.0-2.0, so I increased her Synthroid to 100 mcg/day every day.  B. Her TFTs in June 2020, however, were hyperthyroid, so I asked her to resume her prior Synthroid dose of 100 mcg/day for 6 days each week, but take only 1/2 pill on the 7th days.    2. Hashimoto's thyroiditis: Her  Hashimoto's disease is clinically quiescent, but intermittently active. The pattern of all three TFTs shifting in parallel together in the same direction, upward or downward, that occurred between October 2014 and March 2015, is pathognomonic for having had an interim flare up of Hashimoto's thyroiditis.  3. Goiter: The thyroid gland was about the same size at her last visit, but the lobes had shifted in size again. The process of waxing and waning of thyroid gland size is also c/w evolving Hashimoto's disease.  4. Primary Amenorrhea/Congenital small (hypoplastic) uterus:   A. When she became euthyroid, she had increases in [facility] and [place] into the menopausal range. Since initiating estradiol therapy, however, her LH and FSH had decreased and her testosterone and estradiol had increased. Some of these increases may have been due to increased ovarian production of these hormones. Some of the increased testosterone may have been caused by the effects of overly fat adipose cell cytokines causing insulin resistance and hyperinsulinemia, the hyperinsulinemia in turn causing elevated testosterone. Some of the increased estradiol may have been due to increased aromatization of testosterone by adipose cells.   B. When we began estrogen replacement therapy, our immediate estrogen replacement goals were three-fold:   1. To promote estrogenization of breasts and vaginal tissues   2. To promote feminization of her brain and body overall   3. To improve bone mineral density   C. Damian has responded well to [place]. When she saw Dr. Charlesetta Garibaldi in June 2019, Dr. Charlesetta Garibaldi started Provera for 14 days each month, resulting in monthly periods for Xiana.  5. Obesity: Patient's weight had increased at her last visit. After re-joining the work force, she had significantly reduced her exercise walking. She says that she  is walking more now.    6. Developmental delays: As noted in my November 2012 note, the patient had an IQ test  which gave her an IQ of 42. In many prior visits her interactions were c/w that IQ or perhaps a somewhat lower IQ.  At her last several visits and again at today's visit she was more engaged and acted quite normally.  7. GERD: Her GERD and dyspepsia are well-controlled with omeprazole. At this point she shows no signs of tachyphylaxis.  8. Kyphosis: Her repeat scoliosis series did not show progression. In fact, there was a suggestion of improvement. 9. Hypertension: Her BP was lower at her last visit, but the DBP was still mildly elevated. She needs to walk regularly. 10. Pre-diabetes: Her HbA1c was within normal limits at her last visit, but higher, paralleling her increase in weight.  11. Vitamin D deficiency disease: Her vitamin D level in October 2018 was good, c/w her taking her Biotech regularly. Her vitamin D level in April 2019 was lower and was still a bit low in October 2019. She had been missing some vitamin D and calcium doses. She says that she has been doing better now.  PLAN: 1. Diagnostic: Repeat TFTs, vitamin D, PTH, and calcium prior to her next visit. 2. Therapeutic: Continue the mini-Vivelle at 0.100 mg patch every 3-4 days and Provera, 10 mg daily for 14 days each month, or change doses, as directed by Dr. Charlesetta Garibaldi.  Continue omeprazole, 20 mg, twice daily. Take one Citracal-D, twice daily, at lunch and dinner.  Continue Biotech one 50,00 IU capsule once a week. Add 1-2 Tums at bedtime. Change the Synthroid dose to 100 mcg/day for 6 days each week, but take only 1/2 pill on the 7th days.  3. Patient education: We again discussed the fact that we started ERT in order to promote feminization and to achieve as much bone mineralization as possible. We still do not know if the uterus will grow enough for her to be a candidate for in vitro fertilization. We discussed optimizing her calcium-vitamin D status.  4. Follow-up: Follow-up visit in 4 months.  Level of Service: This visit lasted in  excess of 55 minutes. More than 50% of the visit was devoted to counseling.  Tillman Sers, MD, CDE Adult and Pediatric Endocrinology   This is a Pediatric Specialist E-Visit follow up consult provided via Telephone. Watt Climes consented to an E-Visit consult today.  Location of patient: Briselda is at her home.  Location of provider: Tillman Sers, MD is at his office. Patient was referred by Saddie Benders, MD   The following participants were involved in this E-Visit: Azaiah and Dr. Tobe Sos.  Chief Complain/ Reason for E-Visit today: follow up of autoimmune hypothyroidism, thyroiditis, primary amenorrhea, congenital small uterus, GERD, and vitamin D deficiency. Total time on call: 30 minutes Follow up: 4 months

## 2019-04-10 NOTE — Patient Instructions (Signed)
Follow up visit in 4 months.  

## 2019-05-23 ENCOUNTER — Other Ambulatory Visit: Payer: Self-pay | Admitting: Allergy

## 2019-05-23 DIAGNOSIS — J4530 Mild persistent asthma, uncomplicated: Secondary | ICD-10-CM

## 2019-06-22 ENCOUNTER — Other Ambulatory Visit: Payer: Self-pay | Admitting: Allergy

## 2019-06-22 DIAGNOSIS — J4530 Mild persistent asthma, uncomplicated: Secondary | ICD-10-CM

## 2019-06-27 ENCOUNTER — Encounter: Payer: Self-pay | Admitting: Allergy

## 2019-06-27 ENCOUNTER — Ambulatory Visit (INDEPENDENT_AMBULATORY_CARE_PROVIDER_SITE_OTHER): Payer: BC Managed Care – PPO | Admitting: Allergy

## 2019-06-27 ENCOUNTER — Other Ambulatory Visit: Payer: Self-pay

## 2019-06-27 VITALS — BP 118/90 | HR 84 | Temp 97.9°F | Resp 16 | Ht 61.0 in | Wt 179.2 lb

## 2019-06-27 DIAGNOSIS — J4530 Mild persistent asthma, uncomplicated: Secondary | ICD-10-CM

## 2019-06-27 DIAGNOSIS — J310 Chronic rhinitis: Secondary | ICD-10-CM

## 2019-06-27 MED ORDER — CETIRIZINE HCL 10 MG PO TABS: ORAL_TABLET | ORAL | 1 refills | Status: AC

## 2019-06-27 MED ORDER — ALBUTEROL SULFATE (2.5 MG/3ML) 0.083% IN NEBU: 2.5000 mg | INHALATION_SOLUTION | Freq: Four times a day (QID) | RESPIRATORY_TRACT | 1 refills | Status: AC | PRN

## 2019-06-27 MED ORDER — MONTELUKAST SODIUM 10 MG PO TABS: 10.0000 mg | ORAL_TABLET | Freq: Every day | ORAL | 5 refills | Status: DC

## 2019-06-27 MED ORDER — ALBUTEROL SULFATE HFA 108 (90 BASE) MCG/ACT IN AERS: 2.0000 | INHALATION_SPRAY | RESPIRATORY_TRACT | 1 refills | Status: AC | PRN

## 2019-06-27 NOTE — Progress Notes (Signed)
Follow-up Note  RE: Carolyn [name] MRN: [id number] DOB: 1991-08-31 Date of Office Visit: 06/27/2019   History of present illness: Carolyn [name] is a 28 y.o. female presenting today for follow-up of asthma and chronic rhinitis.  She was last seen in the office on 06/07/18 by myself.  She states she has been doing well since last visit without any major health changes, surgeries or hospitalizations.  She states she has not used any albuterol in years.  She denies nighttime awakenings or daytime symptoms at this time.  Denies any ED/UC visits since last visit and no systemic steroid needs.  She continues to take singulair daily.  She states she does wake up couple mornings a month with nasal congestion that resolves over the morning.  She states it is not very bothersome.  She does take zyrtec on most days.   She does continue on omeprazole for reflux control.   Review of systems: Review of Systems  Constitutional: Negative for chills, fever and malaise/fatigue.  HENT: Positive for congestion. Negative for ear discharge, ear pain, nosebleeds and sore throat.   Eyes: Negative for pain, discharge and redness.  Respiratory: Negative.   Cardiovascular: Negative.   Gastrointestinal: Negative.   Musculoskeletal: Negative.   Skin: Negative for itching and rash.  Neurological: Negative for headaches.    All other systems negative unless noted above in HPI  Past medical/social/surgical/family history have been reviewed and are unchanged unless specifically indicated below.  No changes  Medication List: Allergies as of 06/27/2019      Reactions   Clarithromycin Hives   Codeine Other (See Comments)   Unsure of reaction type   Morphine And Related Nausea And Vomiting   Penicillins    Has patient had a PCN reaction causing immediate rash, facial/tongue/throat swelling, SOB or lightheadedness with hypotension: Unknown Has patient had a PCN reaction causing severe rash involving mucus  membranes or skin necrosis: Unknown Has patient had a PCN reaction that required hospitalization: No Has patient had a PCN reaction occurring within the last 10 years: No If all of the above answers are "NO", then may proceed with Cephalosporin use.   Omnicef [cefdinir] Rash      Medication List       Accurate as of June 27, 2019  4:14 PM. If you have any questions, ask your nurse or doctor.        STOP taking these medications   ibuprofen 600 MG tablet Commonly known as: ADVIL Stopped by: Carolyn Stembridge Charmian Muff, MD     TAKE these medications   albuterol 108 (90 Base) MCG/ACT inhaler Commonly known as: VENTOLIN HFA Inhale 2 puffs into the lungs every 4 (four) hours as needed for wheezing or shortness of breath.   albuterol (2.5 MG/3ML) 0.083% nebulizer solution Commonly known as: PROVENTIL Take 3 mLs (2.5 mg total) by nebulization every 6 (six) hours as needed (FOR WHEEZING/SHORTNESS OF BREATH.).   CALCIUM PO Calcium 500 + D  daily   cetirizine 10 MG tablet Commonly known as: ZYRTEC Take one tablet daily for runny nose or itching.   estradiol 0.1 MG/24HR patch Commonly known as: VIVELLE-DOT PLACE 1 PATCH ONTO THE SKIN 2 (TWO) TIMES A WEEK.   levothyroxine 100 MCG tablet Commonly known as: SYNTHROID TAKE ONE TABLET BY MOUTH EVERY DAY.   medroxyPROGESTERone 10 MG tablet Commonly known as: PROVERA Take 10 mg by mouth See admin instructions. TAKE 1 TABLET EVERY DAY FOR 14 DAYS OUT THE MONTH  montelukast 10 MG tablet Commonly known as: SINGULAIR Take 1 tablet (10 mg total) by mouth at bedtime. What changed: See the new instructions. Changed by: Carolyn Benway Larose HiresPatricia Quintasha Gren, MD   omeprazole 20 MG capsule Commonly known as: PRILOSEC TAKE 1 CAPSULE (20 MG TOTAL) BY MOUTH 2 (TWO) TIMES DAILY.   VITAMIN D PO Vitamin D  weekly       Known medication allergies: Allergies  Allergen Reactions  . Clarithromycin Hives  . Codeine Other (See Comments)     Unsure of reaction type  . Morphine And Related Nausea And Vomiting  . Penicillins     Has patient had a PCN reaction causing immediate rash, facial/tongue/throat swelling, SOB or lightheadedness with hypotension: Unknown Has patient had a PCN reaction causing severe rash involving mucus membranes or skin necrosis: Unknown Has patient had a PCN reaction that required hospitalization: No Has patient had a PCN reaction occurring within the last 10 years: No If all of the above answers are "NO", then may proceed with Cephalosporin use.   Carolyn [name]. Omnicef [Cefdinir] Rash     Physical examination: Blood pressure 118/90, pulse 84, temperature 97.9 F (36.6 C), temperature source Temporal, resp. rate 16, height 5\' 1"  (1.549 m), weight 179 lb 3.2 oz (81.3 kg), SpO2 98 %.  General: Alert, interactive, in no acute distress. HEENT: PERRLA, TMs pearly gray, turbinates non-edematous without discharge, post-pharynx non erythematous. Neck: Supple without lymphadenopathy. Lungs: Clear to auscultation without wheezing, rhonchi or rales. {no increased work of breathing. CV: Normal S1, S2 without murmurs. Abdomen: Nondistended, nontender. Skin: Warm and dry, without lesions or rashes. Extremities:  No clubbing, cyanosis or edema. Neuro:   Grossly intact.  Diagnositics/Labs  Spirometry: FEV1: 1.51L 51%, FVC: 1.62L 47%, ratio consistent with restrictive pattern.  relatively stable from prior study   Assessment and plan:   Mild persistent asthma, currently asymptomatic  - Continue Singulair daily.  - have access to albuterol inhaler 2 puffs or nebulizer 1 vial every 4-6 hours as needed for cough/wheeze/shortness of breath/chest tightness.  May use 15-20 minutes prior to activity.   Monitor frequency of use.    Chronic rhinitis, well controlled  - Zyrtec 10mg  once daily as needed.  Follow-up in 12 month or sooner if needed.  I appreciate the opportunity to take part in Carolyn [name]'s care. Please do not  hesitate to contact me with questions.  Sincerely,   Carolyn AyeShaylar Jeneal Vogl, MD Allergy/Immunology [facility]

## 2019-06-27 NOTE — Patient Instructions (Addendum)
Mild persistent asthma, currently asymptomatic  - Continue Singulair daily.  - have access to albuterol inhaler 2 puffs or nebulizer 1 vial every 4-6 hours as needed for cough/wheeze/shortness of breath/chest tightness.  May use 15-20 minutes prior to activity.   Monitor frequency of use.    Chronic rhinitis, well controlled  - Zyrtec 10mg  once daily as needed.  Follow-up in 12 month or sooner if needed.

## 2019-07-02 ENCOUNTER — Other Ambulatory Visit (INDEPENDENT_AMBULATORY_CARE_PROVIDER_SITE_OTHER): Payer: Self-pay | Admitting: "Endocrinology

## 2019-07-02 DIAGNOSIS — E063 Autoimmune thyroiditis: Secondary | ICD-10-CM

## 2019-07-25 ENCOUNTER — Encounter (INDEPENDENT_AMBULATORY_CARE_PROVIDER_SITE_OTHER): Payer: Self-pay

## 2019-08-02 LAB — VITAMIN D 25 HYDROXY (VIT D DEFICIENCY, FRACTURES): Vit D, 25-Hydroxy: 23 ng/mL — ABNORMAL LOW (ref 30–100)

## 2019-08-02 LAB — TSH: TSH: 2.21 mIU/L

## 2019-08-02 LAB — T4, FREE: Free T4: 1.3 ng/dL (ref 0.8–1.8)

## 2019-08-02 LAB — PTH, INTACT AND CALCIUM
Calcium: 9.1 mg/dL (ref 8.6–10.2)
PTH: 42 pg/mL (ref 14–64)

## 2019-08-02 LAB — T3, FREE: T3, Free: 2.7 pg/mL (ref 2.3–4.2)

## 2019-08-12 ENCOUNTER — Other Ambulatory Visit: Payer: Self-pay

## 2019-08-12 ENCOUNTER — Encounter (INDEPENDENT_AMBULATORY_CARE_PROVIDER_SITE_OTHER): Payer: Self-pay | Admitting: "Endocrinology

## 2019-08-12 ENCOUNTER — Ambulatory Visit (INDEPENDENT_AMBULATORY_CARE_PROVIDER_SITE_OTHER): Payer: BC Managed Care – PPO | Admitting: "Endocrinology

## 2019-08-12 VITALS — BP 124/68 | HR 76 | Wt 184.2 lb

## 2019-08-12 DIAGNOSIS — R7303 Prediabetes: Secondary | ICD-10-CM | POA: Diagnosis not present

## 2019-08-12 DIAGNOSIS — E049 Nontoxic goiter, unspecified: Secondary | ICD-10-CM

## 2019-08-12 DIAGNOSIS — I10 Essential (primary) hypertension: Secondary | ICD-10-CM

## 2019-08-12 DIAGNOSIS — Q51811 Hypoplasia of uterus: Secondary | ICD-10-CM

## 2019-08-12 DIAGNOSIS — E063 Autoimmune thyroiditis: Secondary | ICD-10-CM

## 2019-08-12 DIAGNOSIS — K219 Gastro-esophageal reflux disease without esophagitis: Secondary | ICD-10-CM

## 2019-08-12 DIAGNOSIS — E6609 Other obesity due to excess calories: Secondary | ICD-10-CM

## 2019-08-12 DIAGNOSIS — N910 Primary amenorrhea: Secondary | ICD-10-CM | POA: Diagnosis not present

## 2019-08-12 DIAGNOSIS — E559 Vitamin D deficiency, unspecified: Secondary | ICD-10-CM

## 2019-08-12 DIAGNOSIS — Z6834 Body mass index (BMI) 34.0-34.9, adult: Secondary | ICD-10-CM

## 2019-08-12 LAB — POCT GLYCOSYLATED HEMOGLOBIN (HGB A1C): Hemoglobin A1C: 5.6 % (ref 4.0–5.6)

## 2019-08-12 LAB — POCT GLUCOSE (DEVICE FOR HOME USE): POC Glucose: 133 mg/dl — AB (ref 70–99)

## 2019-08-12 NOTE — Progress Notes (Signed)
Chief complaint: The patient presents at her clinic visit today for follow-up of primary amenorrhea, congenital hypoplastic uterus, acquired autoimmune hypothyroidism, Hashimoto's thyroiditis, goiter, kyphosis, scoliosis, osteopenia, s/p repair of double outlet right ventricle, and gastroesophageal reflux disease  History of present illness: The patient is a 28 year-old Caucasian young woman. She was unaccompanied.  1. We have followed this young woman since 10/28/2010 when she was referred by Dr. Crawford Givens, [facility] and Gynecology, for evaluation of hypothyroidism and primary amenorrhea.   A. At age 49 months the child was noted to be somewhat slow to walk. She was referred to a pediatric neurologist, Dr. Wyline Copas, for further evaluation. Dr. Gaynell Face noted some minor gross motor and fine motor delays, but also noted a significant cardiac murmur. 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 After several years of follow-up, the patient underwent corrective surgery in 2009. She has done well clinically since that time.  B. 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 The geneticist noted the presence of a scoliosis and some limb length discrepancy. Edia had flattening of the left occipital region as compared to the right. Her scalp hair was thin, especially sparse in the temporal regions. The forehead was flat. She had short palpebral fissures and a wide nasal bridge. Epicanthal folds were present. She had a thin upper lip and some underbite. She also had facial asymmetry with the right cheek being more prominent than the left. The lower lip was also more prominent on the right than on the left. The neck was short and wide. The nipples were  felt to be widely spaced. Labia majora were slightly hypoplastic. She had mild soft tissue syndactyly between the third and fourth digits bilaterally. The right leg was longer than the left.  The right thigh was somewhat longer than the left thigh. The right foot was longer than the left foot. All of the right toes were larger than the left toes. Left nails were smaller and dystrophic. She also had a thoracolumbar scoliosis with convexity to the right. She walked with a tilt of her pelvis to the left side. 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 Because the clinical findings suggested the possibility of a genetic disorder, a karyotype was obtained. She had a 17, XX karyotype. A FISH study was also performed. This study showed no evidence for the common form of DiGeorge Syndrome. The geneticist was not able to make a specific genetic diagnosis. Although the geneticist requested that the family return for a follow-up examination, it appears that did not happen.  C. On 09/03/10 the patient was evaluated at [place]. The reason for referral was primary amenorrhea at age 39. The patient's breasts were noted to be Tanner stage III. The vagina looked normal externally. The patient was unable to tolerate either a speculum exam or pelvic exam. Pregnancy test was negative. Laboratory data showed a TSH of 9.296, free T4 0.91, and T3 of 126.5. Her TSH was definitely elevated. The free T4 and T3 were in the lower portion of the normal range. FSH was very elevated at  92.5. Prolactin was 4.2. Repeat karyotype was 31, XX. A pelvic ultrasound was performed in the [facility] office. The uterus and ovaries were  not clearly seen. Endometrial stripe was not seen. It was commented that additional imaging may be necessary.  D. On 10/28/10 the patient was seen by our former physician assistant, who obtained additional history to include past  issues with allergies, asthma, and gastroesophageal reflux disease. At that point the patient was taking Singulair daily, Advair daily, omeprazole daily, and cetirizine (Zyrtec) as needed. On examination the patient weighed 148 pounds which was at the 80th percentile. Her height was at the 8th percentile. Her blood pressure was 124/80 and heart rate was 68. She did have a somewhat dysmorphic facies. A mildly enlarged, but diffusely enlarged, nontender goiter was noted. Laboratory data showed a TSH of 7.517, free T4 of 1.02, and free T3 of 3.4. FSH was 90 and LH was 35. Testosterone was 23.56. Estradiol was 17.2. She definitely had both primary hypogonadism and primary hypothyroidism. At that point she was started on Synthroid, 25 mcg per day.  2. Upon our physician assistant's departure at the end of March 2012, the patient was rescheduled to see me on 01/31/2011. She was taking her Synthroid 25, mcg per day at that time. Her goiter was approximately 20-25 grams in size. The thyroid was non-tender. She was still amenorrheic. Thyroid tests done on that day showed a TSH of 1.667, free T4  1.26, and free T3 3.5. These tests were mid-range normal. Her TPO antibody was elevated at 299, c/w Hashimoto's thyroiditis. She was trying to follow our  Eat Right Diet plan. She was also walking at least an hour per day. She had lost 16 pounds. At her visit on 08/30/11, I met with the patient and her father.The patient was then taking Synthroid, 37.5 mcg/day.  I learned that her mother was not attending the visits in our clinic on the building's third floor because the mother was very afraid of heights and elevators. I made arrangements to see the patient and parents in the first floor radiology conference room on 09/13/11. Dr. Janeal Holmes, MD, PhD, our staff geneticist, joined me on that visit and we provided education to the family about possible Turner's Syndrome mosaicism, hypothyroidism, and Hashimoto's diease.   3. On  03/15/12 Dr. Abelina Bachelor and I contacted Dr. Freddy Jaksch, pediatric endocrinologist at [facility] and a nationally recognized expert on Turner's Syndrome. We presented Shanielle's case to Dr. Melina Fiddler. Although Dr. Melina Fiddler agreed that Ander Purpura likely had a genetic basis for her complex congenital heart disease and other issues, Dr. Melina Fiddler did not feel that Assia had Turner's mosaicism, but Dr, Melina Fiddler could not identify any other recognized syndrome that would fit Donyale. When I asked Dr. Melina Fiddler her opinion as to whether or not we needed to surgically define the gonads before beginning hormone replacement therapy, Dr. Melina Fiddler stated that we probably did not need to do so, but she did understand my concern that if an ovotestis or ovotestes were present, it/they should be removed due to the risk of possible later development of gonadal cancer. Dr. Melina Fiddler suggested that when we were ready to start hormone replacement therapy we use Vivelle-Dot transdermal estradiol, beginning with a dose of 25 mcg/day and gradually increasing the dose to 100 mcg/day over 1-2 years time. We should then wait for spontaneous occurrence of menses.  We could then add a progestin, such as Prometrium, on days 1-10 of each month.     4. In the succeeding six years we have dealt with several different issues:  A. Hypothyroidism secondary to Hashimoto's thyroiditis: Jessicaann's Hashimoto's disease T lymphocytes  have gradually but progressively destroyed more thyrocytes. In response, we have gradually increased the patient's Synthroid dosage to 100 mcg/day on 6-7 days per week in order to keep her euthyroid.   B. Primary amenorrhea: We have also been gradually increasing the strength of her Vivelle-Dot patches.  In September 2018 Dr. Charlesetta Garibaldi started Monika on Provera, 10 mg/day for 14 days each month. Kymorah has had menses every month since then. Katerina also underwent a D%C and hysteroscopy on 11/10/17. One vaginal polyp was noted. The  endometrium was essentially acellular. Keelynn says that Dr. Charlesetta Garibaldi told her that everything was fine.   C. Vitamin D deficiency: We have also adjusted her Citracal-D and Biotech vitamin D doses to optimize her bone mineral status.  5. The patient's last [facility] e-visit was on 04/10/19. I continued all of her medications at their current doses, except for changing her Synthroid to 100 mcg/day for 6 days each week and 50 mcg/day on the 7th days.  A. In the interim she has been healthy.  AAnder Purpura has not noticed any changes in breast size or breast tenderness. LMP was about 4 weeks ago.  B. She saw Dr. Charlesetta Garibaldi on 04/04/18, Dr. Charlesetta Garibaldi continued her Provera for 14 days each month. Now Amya has regular periods following each cycle of Provera. Jhane has not yet contacted Dr. Berneta Sages office to arrange for a follow up visit.  C. She remains on the mini-Vivelle 0.100 patch every 3-4 days; Provera, 10 mg/day for 14 days each month; Synthroid 100 mcg/day for 6 days each week, but only 50 mcg/day on the 7th days; omeprazole, 20 mg, twice daily; and Citracal-D twice daily. She also takes Biotech form of vitamin D, 50,000 IU per week.   D. She is eating about the same. She has been walking "every chance I get".  E. She is now covered by [facility]  6. Pertinent Review of Systems: Constitutional: Jadaya feels "pretty good". She is healthy and has no significant complaints. Eyes: Vision is good. There are no significant eye complaints. Neck: The patient has no complaints of anterior neck swelling, soreness, tenderness,  pressure, discomfort, or difficulty swallowing.  Heart: The patient has no complaints of palpitations, irregular heat beats, chest pain, or chest pressure. She saw her [facility] pediatric cardiologist, Dr Ermalene Searing, in mid-October 2019. He gave hear a clean bill of health. She no longer needs cardiology follow up.  Gastrointestinal: Bowel movents seem normal. The patient has no complaints of  excessive hunger, acid reflux, upset stomach, stomach aches or pains, diarrhea, or constipation. Legs: Muscle mass and strength seem normal. There are no complaints of numbness, tingling, burning, or pain. No edema is noted. Feet: There are no obvious foot problems. There are no complaints of numbness, tingling, burning, or pain. No edema is noted. Hair: She says that her scalp hair seems to be "pretty good". She no longer feels that she is losing too much hair.      PAST MEDICAL, FAMILY, AND SOCIAL HISTORY:  1. School and Family: She is no longer looking for work. Dad and both paternal grandparents had thin hair. Her mother died of metastatic lung CA in 12/31/15. 2. Activities: She walks at times.    3. Tobacco, alcohol, and illicit drugs: None 4. PCP: Dr. Saddie Benders 5. GYN: Dr. Gwynneth Munson Dillard 6. Genetics: Dr. Janeal Holmes, if needed again  REVIEW OF SYSTEMS: There are no other significant problems involving her other body systems.  PHYSICAL EXAM: BP 124/68  Pulse 76   Wt 184 lb 3.2 oz (83.6 kg)   BMI 34.80 kg/m   Constitutional: Claire is alert and reasonably upbeat today. Her weight has increased 6 pounds in the past year. Her insight seems fairly normal.  Her demeanor is again fairly passive and she does not volunteer any information.  Eyes: There is no arcus or proptosis.  Mouth: The oropharynx appears normal. The tongue appears normal. There is normal oral moisture. There is no obvious gingivitis. Neck: There are no bruits present. The thyroid gland appears normal in size. The thyroid gland is approximately 20 grams in size. The consistency of the thyroid gland is normal.. There is no thyroid tenderness to palpation. Lungs: The lungs are clear. Air movement is good. Heart: The heart rhythm and rate appear normal. Heart sounds S1 and S2 are normal. I do not appreciate any pathologic heart murmurs. Abdomen: The abdominal size is more enlarged. Bowel sounds are normal. The abdomen  is soft and non-tender. There is no obviously palpable hepatomegaly, splenomegaly, or other masses.  Arms: Muscle mass appears appropriate for age.  Hands: There is no obvious tremor. Phalangeal and metacarpophalangeal joints appear normal. Palms are normal. Legs: Muscle mass appears appropriate for age. There is no edema.  Neurologic: Muscle strength is normal for age and gender  in both the upper and the lower extremities. Muscle tone appears normal. Sensation to touch is normal in the legs. Her gait is abnormal due to her leg length discrepancy.   Laboratory data:     Labs 08/12/19: HbA1c 5.6%, CBG 102  Labs 08/01/19: TSH 2.21, free T4 1.3, free T3 2.7; PTH 42, calcium 9.1, 25-OH vitamin D 23  Labs 04/01/19: TSH 0.22, free T4 1.7, free T3 2.9; PTH 25, calcium 9.2, 25-OH vitamin D 41  Labs 12/14/18; TSH 2.66, free T4 1.6, free T3 3.1; PTH 34, calcium 9.2 25-OH vitamin D 26  Labs 08/16/18: HbA1c 5.5%, CBG 102  Labs 08/02/18: TSH 2.13, free T4 1.5, free T3 3.2; PTH 35, calcium 8.9, 25-OH vitamin D 29  Labs 05/02/18: TSH 0.27, free T4 1.5, free T3 2.8  Labs 03/13/18: HbA1c 5.4%;   Labs 02/07/18: TSH 5.41, free T4 1.5, free T3 2.9; 25-OH vitamin D 33, PTH 34, calcium 9.2  Labs 11/09/17: HbA1c 5.5%, CBG 96  Labs 11/03/17: CBC Hgb 9.8, Hct 31.4, MCV 68.9, MCH 21.5  Labs 08/15/17: TSH 0.22, free T4 2.1, free T3 3.3; 1, 25-OH vitamin D 53 ; LH 0.5, FSH 4.2, estradiol 79, testosterone 15; CMP normal  Labs 04/25/17: HbA1c 5.5%, CBG 92  Labs 04/12/17: TSH 0.65, free T4 1.8, free T3 3.3; LH 4.4, FSH 4.4, estradiol 76  Labs 12/19/16: HbA1c 5.3%, CBG 95  Labs 12/06/16: LH 4.0, FSH 5.5, estradiol 135, testosterone 19, free testosterone 2.3; TSH  0.29, free T4 1.7, free T3 3.1; PTH 38, calcium 9.1, vitamin D 53  Labs 08/18/16: HbA1c 5.7%  Labs 08/08/16: TSH 0.78, free T4 1.6, free T3 3.1; LH 3.1, FSH 5.8, estradiol 33, testosterone 16; PTH 25, calcium 9.0, 25-OH vitamin D 56  Labs 05/18/16: HbA1c  5.6%  Labs 04/25/16: TSH 2.20, free T4 1.6, free T3 2.8; PTH 30, calcium 9.1, 25-OH vitamin D 67;  estradiol 92  Labs 01/04/16: HbA1c 5.5%  Labs 12/28/15: TSH 0.83, free T4 1.7, free T3 3.0; PTH 54, calcium 9.2, 25-OH vitamin D 21; CMP normal; LH 2.0, FSH 6.3, estradiol 119,  testosterone  43, free testosterone 8.5  08/17/15: HbA1c 5.6%  Labs 08/10/15: TSH 0.942, free T4 1.49, free T3 2.7; LH 2.3, FSH 13.1, testosterone 69, estradiol 71.0; PTH 44, calcium 9.3, 25-OH vitamin D 19  Labs 04/02/15: HbA1c was 6.0%. PTH 36, calcium 9.5, 25-OH vitamin D 20; LH 6.6, FSH 13, estradiol 84.8, testosterone 37; TSH 2.152, free T4 1.31, free T3 2.9  Labs 11/24/14: HbA1c was 5.7% today, compared with 6.1% at last visit and with 5.8% at the visit prior; calcium 9.2, PTH 49; 25-OH vitamin D 22; TSH 0.449, free T4 1.54, free T3 3.5; testosterone 41, estradiol 34.4  Labs 07/21/14: CMP normal; estradiol 30.2, FSH 64.1, LH 30.1, testosterone 41; TSH 3.596, free T4 1.75, free T3 3.3; PTH 42, calcium 9.4, 215-hydroxy vitamin D 33  Labs 12/24/13: TSH 0.768, free T4 1.42, free T3 3.0; estradiol 52.3, FSH 26.6, LH 10.3, testosterone 46; CMP normal   Labs 08/07/13: BMP normal, with calcium 9.5; estradiol 22.7; FSH 46.7, LH 18.3; TSH 1.787, free T4 1.63, free T3 3.1; testosterone 43   Labs 05/03/13: Estradiol 17.5, testosterone 66, free testosterone 14.9, LH 44.8, FSH 91.4, 25-hydroxy vitamin D 32, 1,25-dihydroxy vitamin D 55  Labs 01/18/13: BMP normal, to include calcium of 9.9; TSH 1.156, free T4 1.51, free T3 3.1; testosterone 46 (increased from 33.05), estradiol 16.2 (increased from < 11.8); LH 34.4 (decreased from 36.5), FSH 86.3 (decreased from 106.1); PTH 36.8, phosphorus 4.0, 25-hydroxy vitamin D 27   Labs 10/12/12: TSH 2.079, free T4 1.44, free T3 2.9, LH 36.5,  FSH 106.2, estradiol <11.8, Testosterone 29.94, calcium 9.8, phosphorus 4.5, 25-vitamin D 29,   Labs 05/09/12: TSH 0.351, free T4 1.73, free T3 3.1   Labs  03/05/12: TSH 3.236, free T4 1.60, free T3 3.1   Labs 12/14/11: TSH 3.8, free T4 1.39, free T3 3.3, TPO 381, LH 33.4, FSH 86.9, testosterone 33.5, free testosterone 7.2 (normal 1.0-5.0)  Estradiol 13.6,                Labs 08/22/11: TSH 3.183, free T4 1.40, free T3 3.4. testosterone 34.45, estradiol 14.8     Buccal smear - normal  IMAGING:   MRI of pelvis 10/05/15: Uterus has divergent horns, but normal outer fundal contour. Associated fundal thickening is c/w septate uterus. Right ovary measures 11 x 8 mm and has a small simple cyst/follicle. Left ovary measures 11 x 8 mm aid is poorly evaluated but grossly unremarkable.   US pelvis 08/27/15: Uterus measured 9.1 x 3.2 x 4.8 cm. Endometrial thickness was 14 mm. Right ovary was not well visualized. Left ovary measured 2.2 x 2.0 x 2.1 cm. The left ovary appeared normal. There was a non-specific hypoechoic area within the right adnexal region that might be secondary to overlying bowel structures.  BMD 05/29/13: BMD in spine was > 50%. BMD in the hip was above the 95%. Z-scores were 0.3 and 1.68 respectively.   ASSESSMENT:  1. Hypothyroidism:   A. During the past 8 years we have adjusted her thyroid hormone dose many times to try to keep her TSH in the goal range of 1.0-2.0.   B. Her TFTs in June 2020 were hyperthyroid, so I asked her to resume her prior Synthroid dose of 100 mcg/day for 6 days each week, but take only 1/2 pill on the 7th days.  Her TSH this month was acceptable at 2.21.   2. Hashimoto's thyroiditis: Her Hashimoto's disease is clinically quiescent, but intermittently active. The pattern of all three TFTs shifting in parallel together in the same direction, upward  or downward, that occurred between October 2014 and March 2015, is pathognomonic for having had an interim flare up of Hashimoto's thyroiditis.  3. Goiter: The thyroid gland has shrunk back to top-normal size today. The process of waxing and waning of thyroid gland size is also  c/w evolving Hashimoto's disease.  4. Primary Amenorrhea/Congenital small (hypoplastic) uterus:   A. When she became euthyroid, she had increases in [facility] and [place] into the menopausal range. Since initiating estradiol therapy, however, her LH and FSH had decreased and her testosterone and estradiol had increased. Some of these increases may have been due to increased ovarian production of these hormones. Some of the increased testosterone may have been caused by the effects of overly fat adipose cell cytokines causing insulin resistance and hyperinsulinemia, the hyperinsulinemia in turn causing elevated testosterone. Some of the increased estradiol may have been due to increased aromatization of testosterone by adipose cells.   B. When we began estrogen replacement therapy, our immediate estrogen replacement goals were three-fold:   1. To promote estrogenization of breasts and vaginal tissues   2. To promote feminization of her brain and body overall   3. To improve bone mineral density   C. Vergene has responded well to [place]. When she saw Dr. Charlesetta Garibaldi in June 2019, Dr. Charlesetta Garibaldi started Provera for 14 days each month, resulting in monthly periods for [place]ia.   D. We need to re-assess her LH, FSH, estradiol, and testosterone today. 5. Obesity: Patient's weight had increased at her last visit and again today. She says that she is walking now, but is very vague about how often she actually walks.     6. Developmental delays: As noted in my November 2012 note, the patient had an IQ test which gave her an IQ of 23. In many prior visits her interactions were c/w that IQ or perhaps a somewhat lower IQ.  At her last several visits she was more engaged and acted quite normally. Today she was relatively passive again.  7. GERD: Her GERD and dyspepsia are well-controlled with omeprazole. At this point she shows no signs of tachyphylaxis.  8. Kyphosis: Her repeat scoliosis series did not show progression. In fact,  there was a suggestion of improvement. 9. Hypertension: Her BP was lower at her last visit, but the DBP was still mildly elevated. Her BP is good today.  10. Pre-diabetes: Her HbA1c has increased to the upper end of the normal range, paralleling her gain in fat weight.   11. Vitamin D deficiency disease:   A. Her vitamin D levels have fluctuated over time, largely due to whether or not she was taking her Biotech weekly.    B. Her vitamin D level in October 2020 was low again. She says that she has been doing better at taking [employer] weekly.  PLAN: 1. Diagnostic: Repeat her LH, FSH, estradiol, and testosterone today. Repeat TFTs, vitamin D, PTH, and calcium prior to her next visit. 2. Therapeutic: Continue the mini-Vivelle at 0.100 mg patch every 3-4 days and Provera, 10 mg daily for 14 days each month, or change doses, as directed by Dr. Charlesetta Garibaldi.  Continue omeprazole, 20 mg, twice daily. Take one Citracal-D, twice daily, at lunch and dinner.  Continue Biotech one 50,00 IU capsule once a week. Add 1-2 Tums at bedtime. Change the Synthroid dose to 100 mcg/day for 6 days each week, but take only 1/2 pill on the 7th days.  3. Patient education: We again discussed the fact that we  started ERT in order to promote feminization and to achieve as much bone mineralization as possible. We still do not know if the uterus will grow enough for her to be a candidate for in vitro fertilization. We discussed optimizing her calcium-vitamin D status.  4. Follow-up: Follow-up visit in 4 months.  Level of Service: This visit lasted in excess of 57 minutes. More than 50% of the visit was devoted to counseling.  Tillman Sers, MD, CDE Adult and Pediatric Endocrinology

## 2019-08-12 NOTE — Patient Instructions (Signed)
Follow up visit in 4 months. Please repeat lab tests 1-2 weeks prior. 

## 2019-08-17 LAB — TESTOS,TOTAL,FREE AND SHBG (FEMALE)
Free Testosterone: 2.2 pg/mL (ref 0.1–6.4)
Sex Hormone Binding: 23 nmol/L (ref 17–124)
Testosterone, Total, LC-MS-MS: 11 ng/dL (ref 2–45)

## 2019-08-17 LAB — ESTRADIOL, ULTRA SENS: Estradiol, Ultra Sensitive: 118 pg/mL

## 2019-08-17 LAB — LUTEINIZING HORMONE: LH: 3.6 m[IU]/mL

## 2019-08-17 LAB — FOLLICLE STIMULATING HORMONE: FSH: 14.7 m[IU]/mL

## 2019-08-18 ENCOUNTER — Other Ambulatory Visit (INDEPENDENT_AMBULATORY_CARE_PROVIDER_SITE_OTHER): Payer: Self-pay | Admitting: "Endocrinology

## 2019-08-18 DIAGNOSIS — R1013 Epigastric pain: Secondary | ICD-10-CM

## 2019-10-14 ENCOUNTER — Encounter (INDEPENDENT_AMBULATORY_CARE_PROVIDER_SITE_OTHER): Payer: Self-pay

## 2019-12-16 ENCOUNTER — Ambulatory Visit (INDEPENDENT_AMBULATORY_CARE_PROVIDER_SITE_OTHER): Payer: BC Managed Care – PPO | Admitting: "Endocrinology

## 2019-12-16 NOTE — Progress Notes (Deleted)
Chief complaint: The patient presents at her clinic visit today for follow-up of primary amenorrhea, congenital hypoplastic uterus, acquired autoimmune hypothyroidism, Hashimoto's thyroiditis, goiter, kyphosis, scoliosis, osteopenia, s/p repair of double outlet right ventricle, and gastroesophageal reflux disease  History of present illness: The patient is a 29 year-old Caucasian young woman. She was unaccompanied.  1. We have followed this young woman since 10/28/2010 when she was referred by Dr. Crawford Givens, [facility] and Gynecology, for evaluation of hypothyroidism and primary amenorrhea.   A. At age 49 months the child was noted to be somewhat slow to walk. She was referred to a pediatric neurologist, Dr. Wyline Copas, for further evaluation. Dr. Gaynell Face noted some minor gross motor and fine motor delays, but also noted a significant cardiac murmur. 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 After several years of follow-up, the patient underwent corrective surgery in 2009. She has done well clinically since that time.  B. 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 The geneticist noted the presence of a scoliosis and some limb length discrepancy. Edia had flattening of the left occipital region as compared to the right. Her scalp hair was thin, especially sparse in the temporal regions. The forehead was flat. She had short palpebral fissures and a wide nasal bridge. Epicanthal folds were present. She had a thin upper lip and some underbite. She also had facial asymmetry with the right cheek being more prominent than the left. The lower lip was also more prominent on the right than on the left. The neck was short and wide. The nipples were  felt to be widely spaced. Labia majora were slightly hypoplastic. She had mild soft tissue syndactyly between the third and fourth digits bilaterally. The right leg was longer than the left.  The right thigh was somewhat longer than the left thigh. The right foot was longer than the left foot. All of the right toes were larger than the left toes. Left nails were smaller and dystrophic. She also had a thoracolumbar scoliosis with convexity to the right. She walked with a tilt of her pelvis to the left side. 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 Because the clinical findings suggested the possibility of a genetic disorder, a karyotype was obtained. She had a 17, XX karyotype. A FISH study was also performed. This study showed no evidence for the common form of DiGeorge Syndrome. The geneticist was not able to make a specific genetic diagnosis. Although the geneticist requested that the family return for a follow-up examination, it appears that did not happen.  C. On 09/03/10 the patient was evaluated at [place]. The reason for referral was primary amenorrhea at age 39. The patient's breasts were noted to be Tanner stage III. The vagina looked normal externally. The patient was unable to tolerate either a speculum exam or pelvic exam. Pregnancy test was negative. Laboratory data showed a TSH of 9.296, free T4 0.91, and T3 of 126.5. Her TSH was definitely elevated. The free T4 and T3 were in the lower portion of the normal range. FSH was very elevated at  92.5. Prolactin was 4.2. Repeat karyotype was 31, XX. A pelvic ultrasound was performed in the [facility] office. The uterus and ovaries were  not clearly seen. Endometrial stripe was not seen. It was commented that additional imaging may be necessary.  D. On 10/28/10 the patient was seen by our former physician assistant, who obtained additional history to include past  issues with allergies, asthma, and gastroesophageal reflux disease. At that point the patient was taking Singulair daily, Advair daily, omeprazole daily, and cetirizine (Zyrtec) as needed. On examination the patient weighed 148 pounds which was at the 80th percentile. Her height was at the 8th percentile. Her blood pressure was 124/80 and heart rate was 68. She did have a somewhat dysmorphic facies. A mildly enlarged, but diffusely enlarged, nontender goiter was noted. Laboratory data showed a TSH of 7.517, free T4 of 1.02, and free T3 of 3.4. FSH was 90 and LH was 35. Testosterone was 23.56. Estradiol was 17.2. She definitely had both primary hypogonadism and primary hypothyroidism. At that point she was started on Synthroid, 25 mcg per day.  2. Upon our physician assistant's departure at the end of March 2012, the patient was rescheduled to see me on 01/31/2011. She was taking her Synthroid 25, mcg per day at that time. Her goiter was approximately 20-25 grams in size. The thyroid was non-tender. She was still amenorrheic. Thyroid tests done on that day showed a TSH of 1.667, free T4  1.26, and free T3 3.5. These tests were mid-range normal. Her TPO antibody was elevated at 299, c/w Hashimoto's thyroiditis. She was trying to follow our  Eat Right Diet plan. She was also walking at least an hour per day. She had lost 16 pounds. At her visit on 08/30/11, I met with the patient and her father.The patient was then taking Synthroid, 37.5 mcg/day.  I learned that her mother was not attending the visits in our clinic on the building's third floor because the mother was very afraid of heights and elevators. I made arrangements to see the patient and parents in the first floor radiology conference room on 09/13/11. Dr. Janeal Holmes, MD, PhD, our staff geneticist, joined me on that visit and we provided education to the family about possible Turner's Syndrome mosaicism, hypothyroidism, and Hashimoto's diease.   3. On  03/15/12 Dr. Abelina Bachelor and I contacted Dr. Freddy Jaksch, pediatric endocrinologist at [facility] and a nationally recognized expert on Turner's Syndrome. We presented Roneka's case to Dr. Melina Fiddler. Although Dr. Melina Fiddler agreed that Ander Purpura likely had a genetic basis for her complex congenital heart disease and other issues, Dr. Melina Fiddler did not feel that Latavia had Turner's mosaicism, but Dr, Melina Fiddler could not identify any other recognized syndrome that would fit Evelyn. When I asked Dr. Melina Fiddler her opinion as to whether or not we needed to surgically define the gonads before beginning hormone replacement therapy, Dr. Melina Fiddler stated that we probably did not need to do so, but she did understand my concern that if an ovotestis or ovotestes were present, it/they should be removed due to the risk of possible later development of gonadal cancer. Dr. Melina Fiddler suggested that when we were ready to start hormone replacement therapy we use Vivelle-Dot transdermal estradiol, beginning with a dose of 25 mcg/day and gradually increasing the dose to 100 mcg/day over 1-2 years time. We should then wait for spontaneous occurrence of menses.  We could then add a progestin, such as Prometrium, on days 1-10 of each month.     4. In the succeeding eight years we have dealt with several different issues:  A. Hypothyroidism secondary to Hashimoto's thyroiditis: Madeliene's Hashimoto's disease T lymphocytes  have gradually but progressively destroyed more thyrocytes. In response, we have gradually increased the patient's Synthroid dosage to 100 mcg/day on 6-7 days per week in order to keep her euthyroid.   B. Primary amenorrhea: We have also been gradually increasing the strength of her Vivelle-Dot patches.  In September 2018 Dr. Charlesetta Garibaldi started Dorisann on Provera, 10 mg/day for 14 days each month. Andjela has had menses every month since then. Hailey also underwent a D%C and hysteroscopy on 11/10/17. One vaginal polyp was noted.  The endometrium was essentially acellular. Jeweliana says that Dr. Charlesetta Garibaldi told her that everything was fine.   C. Vitamin D deficiency: We have also adjusted her Citracal-D and Biotech vitamin D doses to optimize her bone mineral status.  5. The patient's last [facility]  Clinic visit was on 08/12/19. I continued all of her medications at their current doses, except for changing her Synthroid to 100 mcg/day for 6 days each week and 50 mcg/day on the 7th days.  A. In the interim she has been healthy.  AAnder Purpura has not noticed any changes in breast size or breast tenderness. LMP was about 4 weeks ago.  B. She saw Dr. Charlesetta Garibaldi on 04/04/18, Dr. Charlesetta Garibaldi continued her Provera for 14 days each month. Now Jelissa has regular periods following each cycle of Provera. Sama has not yet contacted Dr. Berneta Sages office to arrange for a follow up visit.  C. She remains on the mini-Vivelle 0.100 patch every 3-4 days; Provera, 10 mg/day for 14 days each month; Synthroid 100 mcg/day for 6 days each week, but only 50 mcg/day on the 7th days; omeprazole, 20 mg, twice daily; and Citracal-D twice daily. She also takes Biotech form of vitamin D, 50,000 IU per week.   D. She is eating about the same. She has been walking "every chance I get".  E. She is now covered by [facility] Medicaid  6. Pertinent Review of Systems: Constitutional: Tiwana feels "pretty good". She is healthy and has no significant complaints. Eyes: Vision is good. There are no significant eye complaints. Neck: The patient has no complaints of anterior neck swelling, soreness, tenderness,  pressure, discomfort, or difficulty swallowing.  Heart: The patient has no complaints of palpitations, irregular heat beats, chest pain, or chest pressure. She saw her [facility] pediatric cardiologist, Dr Ermalene Searing, in mid-October 2019. He gave hear a clean bill of health. She no longer needs cardiology follow up.  Gastrointestinal: Bowel movents seem normal. The patient has no complaints  of excessive hunger, acid reflux, upset stomach, stomach aches or pains, diarrhea, or constipation. Legs: Muscle mass and strength seem normal. There are no complaints of numbness, tingling, burning, or pain. No edema is noted. Feet: There are no obvious foot problems. There are no complaints of numbness, tingling, burning, or pain. No edema is noted. Hair: She says that her scalp hair seems to be "pretty good". She no longer feels that she is losing too much hair.      PAST MEDICAL, FAMILY, AND SOCIAL HISTORY:  1. School and Family: She is no longer looking for work. Dad and both paternal grandparents had thin hair. Her mother died of metastatic lung CA in December 19, 2015. 2. Activities: She walks at times.    3. Tobacco, alcohol, and illicit drugs: None 4. PCP: Dr. Saddie Benders 5. GYN: Dr. Gwynneth Munson Dillard 6. Genetics: Dr. Janeal Holmes, if needed again  REVIEW OF SYSTEMS: There are no other significant problems involving her other body systems.  PHYSICAL EXAM: There were  no vitals taken for this visit.  Constitutional: Sherae is alert and reasonably upbeat today. Her weight has increased 6 pounds in the past year. Her insight seems fairly normal.  Her demeanor is again fairly passive and she does not volunteer any information.  Eyes: There is no arcus or proptosis.  Mouth: The oropharynx appears normal. The tongue appears normal. There is normal oral moisture. There is no obvious gingivitis. Neck: There are no bruits present. The thyroid gland appears normal in size. The thyroid gland is approximately 20 grams in size. The consistency of the thyroid gland is normal.. There is no thyroid tenderness to palpation. Lungs: The lungs are clear. Air movement is good. Heart: The heart rhythm and rate appear normal. Heart sounds S1 and S2 are normal. I do not appreciate any pathologic heart murmurs. Abdomen: The abdominal size is more enlarged. Bowel sounds are normal. The abdomen is soft and non-tender.  There is no obviously palpable hepatomegaly, splenomegaly, or other masses.  Arms: Muscle mass appears appropriate for age.  Hands: There is no obvious tremor. Phalangeal and metacarpophalangeal joints appear normal. Palms are normal. Legs: Muscle mass appears appropriate for age. There is no edema.  Neurologic: Muscle strength is normal for age and gender  in both the upper and the lower extremities. Muscle tone appears normal. Sensation to touch is normal in the legs. Her gait is abnormal due to her leg length discrepancy.   Laboratory data:     Labs 08/12/19: HbA1c 5.6%, CBG 102; LH 3.6, FSH 14.7, estradiol 118, testosterone 11  Labs 08/01/19: TSH 2.21, free T4 1.3, free T3 2.7; PTH 42, calcium 9.1, 25-OH vitamin D 23  Labs 04/01/19: TSH 0.22, free T4 1.7, free T3 2.9; PTH 25, calcium 9.2, 25-OH vitamin D 41  Labs 12/14/18; TSH 2.66, free T4 1.6, free T3 3.1; PTH 34, calcium 9.2 25-OH vitamin D 26  Labs 08/16/18: HbA1c 5.5%, CBG 102  Labs 08/02/18: TSH 2.13, free T4 1.5, free T3 3.2; PTH 35, calcium 8.9, 25-OH vitamin D 29  Labs 05/02/18: TSH 0.27, free T4 1.5, free T3 2.8  Labs 03/13/18: HbA1c 5.4%;   Labs 02/07/18: TSH 5.41, free T4 1.5, free T3 2.9; 25-OH vitamin D 33, PTH 34, calcium 9.2  Labs 11/09/17: HbA1c 5.5%, CBG 96  Labs 11/03/17: CBC Hgb 9.8, Hct 31.4, MCV 68.9, MCH 21.5  Labs 08/15/17: TSH 0.22, free T4 2.1, free T3 3.3; 1, 25-OH vitamin D 53 ; LH 0.5, FSH 4.2, estradiol 79, testosterone 15; CMP normal  Labs 04/25/17: HbA1c 5.5%, CBG 92  Labs 04/12/17: TSH 0.65, free T4 1.8, free T3 3.3; LH 4.4, FSH 4.4, estradiol 76  Labs 12/19/16: HbA1c 5.3%, CBG 95  Labs 12/06/16: LH 4.0, FSH 5.5, estradiol 135, testosterone 19, free testosterone 2.3; TSH  0.29, free T4 1.7, free T3 3.1; PTH 38, calcium 9.1, vitamin D 53  Labs 08/18/16: HbA1c 5.7%  Labs 08/08/16: TSH 0.78, free T4 1.6, free T3 3.1; LH 3.1, FSH 5.8, estradiol 33, testosterone 16; PTH 25, calcium 9.0, 25-OH vitamin D  56  Labs 05/18/16: HbA1c 5.6%  Labs 04/25/16: TSH 2.20, free T4 1.6, free T3 2.8; PTH 30, calcium 9.1, 25-OH vitamin D 67;  estradiol 92  Labs 01/04/16: HbA1c 5.5%  Labs 12/28/15: TSH 0.83, free T4 1.7, free T3 3.0; PTH 54, calcium 9.2, 25-OH vitamin D 21; CMP normal; LH 2.0, FSH 6.3, estradiol 119,  testosterone  43, free testosterone 8.5  08/17/15: HbA1c 5.6%  Labs 08/10/15: TSH  0.942, free T4 1.49, free T3 2.7; LH 2.3, FSH 13.1, testosterone 69, estradiol 71.0; PTH 44, calcium 9.3, 25-OH vitamin D 19  Labs 04/02/15: HbA1c was 6.0%. PTH 36, calcium 9.5, 25-OH vitamin D 20; LH 6.6, FSH 13, estradiol 84.8, testosterone 37; TSH 2.152, free T4 1.31, free T3 2.9  Labs 11/24/14: HbA1c was 5.7% today, compared with 6.1% at last visit and with 5.8% at the visit prior; calcium 9.2, PTH 49; 25-OH vitamin D 22; TSH 0.449, free T4 1.54, free T3 3.5; testosterone 41, estradiol 34.4  Labs 07/21/14: CMP normal; estradiol 30.2, FSH 64.1, LH 30.1, testosterone 41; TSH 3.596, free T4 1.75, free T3 3.3; PTH 42, calcium 9.4, 215-hydroxy vitamin D 33  Labs 12/24/13: TSH 0.768, free T4 1.42, free T3 3.0; estradiol 52.3, FSH 26.6, LH 10.3, testosterone 46; CMP normal   Labs 08/07/13: BMP normal, with calcium 9.5; estradiol 22.7; FSH 46.7, LH 18.3; TSH 1.787, free T4 1.63, free T3 3.1; testosterone 43   Labs 05/03/13: Estradiol 17.5, testosterone 66, free testosterone 14.9, LH 44.8, FSH 91.4, 25-hydroxy vitamin D 32, 1,25-dihydroxy vitamin D 55  Labs 01/18/13: BMP normal, to include calcium of 9.9; TSH 1.156, free T4 1.51, free T3 3.1; testosterone 46 (increased from 33.05), estradiol 16.2 (increased from < 11.8); LH 34.4 (decreased from 36.5), FSH 86.3 (decreased from 106.1); PTH 36.8, phosphorus 4.0, 25-hydroxy vitamin D 27   Labs 10/12/12: TSH 2.079, free T4 1.44, free T3 2.9, LH 36.5,  FSH 106.2, estradiol <11.8, Testosterone 29.94, calcium 9.8, phosphorus 4.5, 25-vitamin D 29,   Labs 05/09/12: TSH 0.351, free T4  1.73, free T3 3.1   Labs 03/05/12: TSH 3.236, free T4 1.60, free T3 3.1   Labs 12/14/11: TSH 3.8, free T4 1.39, free T3 3.3, TPO 381, LH 33.4, FSH 86.9, testosterone 33.5, free testosterone 7.2 (normal 1.0-5.0)  Estradiol 13.6,                Labs 08/22/11: TSH 3.183, free T4 1.40, free T3 3.4. testosterone 34.45, estradiol 14.8     Buccal smear - normal  IMAGING:   MRI of pelvis 10/05/15: Uterus has divergent horns, but normal outer fundal contour. Associated fundal thickening is c/w septate uterus. Right ovary measures 11 x 8 mm and has a small simple cyst/follicle. Left ovary measures 11 x 8 mm aid is poorly evaluated but grossly unremarkable.   US pelvis 08/27/15: Uterus measured 9.1 x 3.2 x 4.8 cm. Endometrial thickness was 14 mm. Right ovary was not well visualized. Left ovary measured 2.2 x 2.0 x 2.1 cm. The left ovary appeared normal. There was a non-specific hypoechoic area within the right adnexal region that might be secondary to overlying bowel structures.  BMD 05/29/13: BMD in spine was > 50%. BMD in the hip was above the 95%. Z-scores were 0.3 and 1.68 respectively.   ASSESSMENT:  1. Hypothyroidism:   A. During the past 8 years we have adjusted her thyroid hormone dose many times to try to keep her TSH in the goal range of 1.0-2.0.   B. Her TFTs in June 2020 were hyperthyroid, so I asked her to resume her prior Synthroid dose of 100 mcg/day for 6 days each week, but take only 1/2 pill on the 7th days.  Her TSH this month was acceptable at 2.21.   2. Hashimoto's thyroiditis: Her Hashimoto's disease is clinically quiescent, but intermittently active. The pattern of all three TFTs shifting in parallel together in the same direction, upward or downward, that  occurred between October 2014 and March 2015, is pathognomonic for having had an interim flare up of Hashimoto's thyroiditis.  3. Goiter: The thyroid gland has shrunk back to top-normal size today. The process of waxing and waning of  thyroid gland size is also c/w evolving Hashimoto's disease.  4. Primary Amenorrhea/Congenital small (hypoplastic) uterus:   A. When she became euthyroid, she had increases in [facility] and [place] into the menopausal range. Since initiating estradiol therapy, however, her LH and FSH had decreased and her testosterone and estradiol had increased. Some of these increases may have been due to increased ovarian production of these hormones. Some of the increased testosterone may have been caused by the effects of overly fat adipose cell cytokines causing insulin resistance and hyperinsulinemia, the hyperinsulinemia in turn causing elevated testosterone. Some of the increased estradiol may have been due to increased aromatization of testosterone by adipose cells.   B. When we began estrogen replacement therapy, our immediate estrogen replacement goals were three-fold:   1. To promote estrogenization of breasts and vaginal tissues   2. To promote feminization of her brain and body overall   3. To improve bone mineral density   C. Calinda has responded well to [place]. When she saw Dr. Charlesetta Garibaldi in June 2019, Dr. Charlesetta Garibaldi started Provera for 14 days each month, resulting in monthly periods for Arta.   D. We need to re-assess her LH, FSH, estradiol, and testosterone today. 5. Obesity: Patient's weight had increased at her last visit and again today. She says that she is walking now, but is very vague about how often she actually walks.     6. Developmental delays: As noted in my November 2012 note, the patient had an IQ test which gave her an IQ of 68. In many prior visits her interactions were c/w that IQ or perhaps a somewhat lower IQ.  At her last several visits she was more engaged and acted quite normally. Today she was relatively passive again.  7. GERD: Her GERD and dyspepsia are well-controlled with omeprazole. At this point she shows no signs of tachyphylaxis.  8. Kyphosis: Her repeat scoliosis series did not  show progression. In fact, there was a suggestion of improvement. 9. Hypertension: Her BP was lower at her last visit, but the DBP was still mildly elevated. Her BP is good today.  10. Pre-diabetes: Her HbA1c has increased to the upper end of the normal range, paralleling her gain in fat weight.   11. Vitamin D deficiency disease:   A. Her vitamin D levels have fluctuated over time, largely due to whether or not she was taking her Biotech weekly.    B. Her vitamin D level in October 2020 was low again. She says that she has been doing better at taking [employer] weekly.  PLAN: 1. Diagnostic: Repeat her LH, FSH, estradiol, and testosterone today. Repeat TFTs, vitamin D, PTH, and calcium prior to her next visit. 2. Therapeutic: Continue the mini-Vivelle at 0.100 mg patch every 3-4 days and Provera, 10 mg daily for 14 days each month, or change doses, as directed by Dr. Charlesetta Garibaldi.  Continue omeprazole, 20 mg, twice daily. Take one Citracal-D, twice daily, at lunch and dinner.  Continue Biotech one 50,00 IU capsule once a week. Add 1-2 Tums at bedtime. Change the Synthroid dose to 100 mcg/day for 6 days each week, but take only 1/2 pill on the 7th days.  3. Patient education: We again discussed the fact that we started ERT in  order to promote feminization and to achieve as much bone mineralization as possible. We still do not know if the uterus will grow enough for her to be a candidate for in vitro fertilization. We discussed optimizing her calcium-vitamin D status.  4. Follow-up: Follow-up visit in 4 months.  Level of Service: This visit lasted in excess of 57 minutes. More than 50% of the visit was devoted to counseling.  Tillman Sers, MD, CDE Adult and Pediatric Endocrinology

## 2019-12-26 ENCOUNTER — Other Ambulatory Visit (INDEPENDENT_AMBULATORY_CARE_PROVIDER_SITE_OTHER): Payer: Self-pay | Admitting: "Endocrinology

## 2019-12-26 DIAGNOSIS — N912 Amenorrhea, unspecified: Secondary | ICD-10-CM

## 2020-03-15 ENCOUNTER — Other Ambulatory Visit (INDEPENDENT_AMBULATORY_CARE_PROVIDER_SITE_OTHER): Payer: Self-pay | Admitting: "Endocrinology

## 2020-03-15 DIAGNOSIS — E063 Autoimmune thyroiditis: Secondary | ICD-10-CM

## 2020-06-18 ENCOUNTER — Other Ambulatory Visit (INDEPENDENT_AMBULATORY_CARE_PROVIDER_SITE_OTHER): Payer: Self-pay | Admitting: "Endocrinology

## 2020-06-18 DIAGNOSIS — E063 Autoimmune thyroiditis: Secondary | ICD-10-CM

## 2020-08-08 ENCOUNTER — Other Ambulatory Visit: Payer: Self-pay | Admitting: Allergy

## 2020-08-08 DIAGNOSIS — J4530 Mild persistent asthma, uncomplicated: Secondary | ICD-10-CM

## 2020-09-04 ENCOUNTER — Other Ambulatory Visit: Payer: Self-pay | Admitting: Allergy

## 2020-09-04 DIAGNOSIS — J4530 Mild persistent asthma, uncomplicated: Secondary | ICD-10-CM

## 2020-09-15 ENCOUNTER — Other Ambulatory Visit (INDEPENDENT_AMBULATORY_CARE_PROVIDER_SITE_OTHER): Payer: Self-pay | Admitting: "Endocrinology

## 2020-09-15 DIAGNOSIS — E063 Autoimmune thyroiditis: Secondary | ICD-10-CM

## 2020-09-24 ENCOUNTER — Other Ambulatory Visit: Payer: Self-pay | Admitting: Allergy

## 2020-09-24 DIAGNOSIS — J4530 Mild persistent asthma, uncomplicated: Secondary | ICD-10-CM

## 2020-09-24 NOTE — Telephone Encounter (Signed)
Called and informed patient that no other refills can be sent in due to her not being seen in over a year and courtesy has been given back in October. Patient asked if singular is over the counter and I informed her that the medication is not over the counter . I asked patient if she wanted to schedule an appointment so that she could continue getting refills and patient denied.

## 2021-02-02 ENCOUNTER — Other Ambulatory Visit (INDEPENDENT_AMBULATORY_CARE_PROVIDER_SITE_OTHER): Payer: Self-pay | Admitting: "Endocrinology

## 2021-02-02 DIAGNOSIS — N912 Amenorrhea, unspecified: Secondary | ICD-10-CM

## 2021-08-18 ENCOUNTER — Telehealth (INDEPENDENT_AMBULATORY_CARE_PROVIDER_SITE_OTHER): Payer: Self-pay | Admitting: "Endocrinology

## 2021-08-18 NOTE — Telephone Encounter (Signed)
Called patient. She hasnt been seen in our clinic in over 2 years. She will need to call her primary for any questions she has. LVM

## 2021-08-18 NOTE — Telephone Encounter (Signed)
  Who's calling (name and relationship to patient) : Patient Carolyn [name]  Best contact [phone]  Provider they see:Dr. Fransico Michael  Reason for call: Patient has medical question that needs to be addressed     PRESCRIPTION REFILL ONLY  Name of prescription:  Pharmacy:

## 2022-05-19 ENCOUNTER — Encounter (INDEPENDENT_AMBULATORY_CARE_PROVIDER_SITE_OTHER): Payer: Self-pay
